# Patient Record
Sex: Female | Born: 1975 | Race: White | Hispanic: No | Marital: Married | Smoking: Never smoker
Health system: Southern US, Community
[De-identification: ages and names within clinical notes are randomized; demographics above are authoritative.]

## PROBLEM LIST (undated history)

## (undated) HISTORY — PX: COLONOSCOPY: SHX174

---

## 1999-07-16 ENCOUNTER — Other Ambulatory Visit: Admission: RE | Admit: 1999-07-16 | Discharge: 1999-07-16 | Payer: Self-pay | Admitting: *Deleted

## 2000-12-23 ENCOUNTER — Other Ambulatory Visit: Admission: RE | Admit: 2000-12-23 | Discharge: 2000-12-23 | Payer: Self-pay | Admitting: Obstetrics and Gynecology

## 2002-02-03 ENCOUNTER — Other Ambulatory Visit: Admission: RE | Admit: 2002-02-03 | Discharge: 2002-02-03 | Payer: Self-pay | Admitting: Obstetrics and Gynecology

## 2002-07-22 ENCOUNTER — Encounter: Payer: Self-pay | Admitting: Obstetrics and Gynecology

## 2002-07-22 ENCOUNTER — Observation Stay (HOSPITAL_COMMUNITY): Admission: AD | Admit: 2002-07-22 | Discharge: 2002-07-23 | Payer: Self-pay | Admitting: Obstetrics and Gynecology

## 2002-09-13 ENCOUNTER — Ambulatory Visit (HOSPITAL_COMMUNITY): Admission: RE | Admit: 2002-09-13 | Discharge: 2002-09-13 | Payer: Self-pay | Admitting: Obstetrics and Gynecology

## 2002-09-13 ENCOUNTER — Encounter: Payer: Self-pay | Admitting: Obstetrics and Gynecology

## 2002-11-01 ENCOUNTER — Inpatient Hospital Stay (HOSPITAL_COMMUNITY): Admission: AD | Admit: 2002-11-01 | Discharge: 2002-11-03 | Payer: Self-pay | Admitting: Obstetrics and Gynecology

## 2002-11-02 ENCOUNTER — Encounter (INDEPENDENT_AMBULATORY_CARE_PROVIDER_SITE_OTHER): Payer: Self-pay | Admitting: Specialist

## 2002-11-04 ENCOUNTER — Encounter: Admission: RE | Admit: 2002-11-04 | Discharge: 2002-12-04 | Payer: Self-pay | Admitting: Obstetrics and Gynecology

## 2003-03-02 ENCOUNTER — Other Ambulatory Visit: Admission: RE | Admit: 2003-03-02 | Discharge: 2003-03-02 | Payer: Self-pay | Admitting: Obstetrics and Gynecology

## 2004-04-11 ENCOUNTER — Other Ambulatory Visit: Admission: RE | Admit: 2004-04-11 | Discharge: 2004-04-11 | Payer: Self-pay | Admitting: Obstetrics and Gynecology

## 2005-04-18 ENCOUNTER — Other Ambulatory Visit: Admission: RE | Admit: 2005-04-18 | Discharge: 2005-04-18 | Payer: Self-pay | Admitting: Obstetrics and Gynecology

## 2006-01-20 ENCOUNTER — Inpatient Hospital Stay (HOSPITAL_COMMUNITY): Admission: AD | Admit: 2006-01-20 | Discharge: 2006-01-20 | Payer: Self-pay | Admitting: Obstetrics and Gynecology

## 2006-01-23 ENCOUNTER — Inpatient Hospital Stay (HOSPITAL_COMMUNITY): Admission: AD | Admit: 2006-01-23 | Discharge: 2006-01-25 | Payer: Self-pay | Admitting: Obstetrics and Gynecology

## 2007-06-02 ENCOUNTER — Emergency Department (HOSPITAL_COMMUNITY): Admission: EM | Admit: 2007-06-02 | Discharge: 2007-06-02 | Payer: Self-pay | Admitting: Emergency Medicine

## 2008-06-17 ENCOUNTER — Ambulatory Visit: Payer: Self-pay | Admitting: Internal Medicine

## 2008-06-17 DIAGNOSIS — R070 Pain in throat: Secondary | ICD-10-CM

## 2008-06-22 LAB — CONVERTED CEMR LAB: Streptococcus, Group A Screen (Direct): NEGATIVE

## 2008-07-04 ENCOUNTER — Telehealth: Payer: Self-pay | Admitting: Internal Medicine

## 2008-08-03 ENCOUNTER — Ambulatory Visit: Payer: Self-pay | Admitting: Gastroenterology

## 2008-08-10 ENCOUNTER — Encounter: Payer: Self-pay | Admitting: Gastroenterology

## 2008-08-10 ENCOUNTER — Ambulatory Visit: Payer: Self-pay | Admitting: Gastroenterology

## 2008-08-12 ENCOUNTER — Encounter: Payer: Self-pay | Admitting: Gastroenterology

## 2008-09-01 ENCOUNTER — Ambulatory Visit: Payer: Self-pay | Admitting: Family Medicine

## 2008-09-05 ENCOUNTER — Telehealth (INDEPENDENT_AMBULATORY_CARE_PROVIDER_SITE_OTHER): Payer: Self-pay | Admitting: *Deleted

## 2008-09-06 ENCOUNTER — Encounter (INDEPENDENT_AMBULATORY_CARE_PROVIDER_SITE_OTHER): Payer: Self-pay | Admitting: *Deleted

## 2008-09-06 LAB — CONVERTED CEMR LAB
ALT: 14 units/L (ref 0–35)
AST: 17 units/L (ref 0–37)
Albumin: 4.5 g/dL (ref 3.5–5.2)
Basophils Absolute: 0 10*3/uL (ref 0.0–0.1)
Bilirubin, Direct: 0 mg/dL (ref 0.0–0.3)
CO2: 30 meq/L (ref 19–32)
Calcium: 9.5 mg/dL (ref 8.4–10.5)
Chloride: 105 meq/L (ref 96–112)
Eosinophils Absolute: 0.1 10*3/uL (ref 0.0–0.7)
Glucose, Bld: 89 mg/dL (ref 70–99)
Hemoglobin: 13.1 g/dL (ref 12.0–15.0)
LDL Cholesterol: 117 mg/dL — ABNORMAL HIGH (ref 0–99)
MCV: 89.6 fL (ref 78.0–100.0)
Monocytes Absolute: 0.4 10*3/uL (ref 0.1–1.0)
Potassium: 4.5 meq/L (ref 3.5–5.1)
Sodium: 140 meq/L (ref 135–145)
Total Bilirubin: 0.8 mg/dL (ref 0.3–1.2)
Total Protein: 7.6 g/dL (ref 6.0–8.3)
VLDL: 11.4 mg/dL (ref 0.0–40.0)
WBC: 6.1 10*3/uL (ref 4.5–10.5)

## 2009-05-31 ENCOUNTER — Inpatient Hospital Stay (HOSPITAL_COMMUNITY): Admission: RE | Admit: 2009-05-31 | Discharge: 2009-06-02 | Payer: Self-pay | Admitting: Obstetrics and Gynecology

## 2010-02-27 ENCOUNTER — Encounter: Payer: Self-pay | Admitting: Family Medicine

## 2010-02-27 ENCOUNTER — Ambulatory Visit (INDEPENDENT_AMBULATORY_CARE_PROVIDER_SITE_OTHER): Payer: Managed Care, Other (non HMO) | Admitting: Family Medicine

## 2010-02-27 DIAGNOSIS — J019 Acute sinusitis, unspecified: Secondary | ICD-10-CM

## 2010-03-08 NOTE — Assessment & Plan Note (Signed)
Summary: sinus infection//fd   Vital Signs:  Patient profile:   35 year old female Weight:      124 pounds BMI:     22.95 Temp:     98.3 degrees F oral BP sitting:   100 / 60  (left arm)  Vitals Entered By: Doristine Devoid CMA (February 27, 2010 9:13 AM) CC: sinus HA and pressure x1 wk    CC:  sinus HA and pressure x1 wk .  History of Present Illness: 35 yo woman here today for ? sinus infxn.  sxs started  ~10 days ago w/ cold sxs.  now w/ facial pain, tooth pain, nasal congestion.  no fever, cough.  no ear pain.  + sick contacts.    Current Medications (verified): 1)  None  Allergies (verified): No Known Drug Allergies  Social History: married, 3 kids (04, 08, 83) pharmacist at [employer]  Review of Systems      See HPI  Physical Exam  General:  Well-developed,well-nourished,in no acute distress; alert,appropriate and cooperative throughout examination Head:  NCAT, + TTP over maxillary sinuses Eyes:  no injxn or inflammation Ears:  R ear normal and L ear normal.   Nose:  External nasal examination shows no deformity or inflammation. Nasal mucosa are pink and moist without lesions or exudates. Mouth:  Oral mucosa and oropharynx without lesions or exudates.  Teeth in good repair. Neck:  No deformities, masses, or tenderness noted. Lungs:  Normal respiratory effort, chest expands symmetrically. Lungs are clear to auscultation, no crackles or wheezes. Heart:  Normal rate and regular rhythm. S1 and S2 normal without gallop, murmur, click, rub or other extra sounds.   Impression & Recommendations:  Problem # 1:  SINUSITIS - ACUTE-NOS (ICD-461.9) Assessment New pt is breast feeding.  start amox.  reviewed supportive care and red flags that should prompt return.  Pt expresses understanding and is in agreement w/ this plan. Her updated medication list for this problem includes:    Amoxicillin 500 Mg Tabs (Amoxicillin) .Marland Kitchen... 2 tabs by mouth two times a day x10 days.  take w/  food.  Complete Medication List: 1)  Amoxicillin 500 Mg Tabs (Amoxicillin) .... 2 tabs by mouth two times a day x10 days.  take w/ food.  Patient Instructions: 1)  You have a sinus infection 2)  Take the Amoxicillin as directed- take w/ food to avoid upset stomach 3)  Drink plenty of fluids 4)  Hang in there!!! Prescriptions: AMOXICILLIN 500 MG TABS (AMOXICILLIN) 2 tabs by mouth two times a day x10 days.  take w/ food.  #40 x 0   Entered and Authorized by:   Neena Rhymes MD   Signed by:   Neena Rhymes MD on 02/27/2010   Method used:   Electronically to        [facility] [id number] (retail)       [address]       [place], [place]  [address]       Ph: [phone]       Fax: [phone]   RxID:   [id number]    Orders Added: 1)  Est. Patient Level III [64332]

## 2010-04-10 LAB — CBC
MCHC: 35.3 g/dL (ref 30.0–36.0)
RBC: 3.18 MIL/uL — ABNORMAL LOW (ref 3.87–5.11)
RDW: 14.5 % (ref 11.5–15.5)
RDW: 14.6 % (ref 11.5–15.5)

## 2010-04-10 LAB — RPR: RPR Ser Ql: NONREACTIVE

## 2010-06-06 ENCOUNTER — Encounter: Payer: Self-pay | Admitting: *Deleted

## 2010-06-07 ENCOUNTER — Encounter: Payer: Self-pay | Admitting: Family Medicine

## 2010-06-07 ENCOUNTER — Ambulatory Visit (INDEPENDENT_AMBULATORY_CARE_PROVIDER_SITE_OTHER): Payer: Managed Care, Other (non HMO) | Admitting: Family Medicine

## 2010-06-07 VITALS — BP 100/60 | Temp 99.0°F | Wt 122.0 lb

## 2010-06-07 DIAGNOSIS — J40 Bronchitis, not specified as acute or chronic: Secondary | ICD-10-CM

## 2010-06-07 MED ORDER — BENZONATATE 200 MG PO CAPS: 200.0000 mg | ORAL_CAPSULE | Freq: Three times a day (TID) | ORAL | Status: AC | PRN

## 2010-06-07 MED ORDER — AZITHROMYCIN 250 MG PO TABS: 250.0000 mg | ORAL_TABLET | Freq: Every day | ORAL | Status: AC

## 2010-06-07 MED ORDER — GUAIFENESIN-CODEINE 100-10 MG/5ML PO SYRP
5.0000 mL | ORAL_SOLUTION | Freq: Three times a day (TID) | ORAL | Status: AC | PRN
Start: 2010-06-07 — End: 2011-06-07

## 2010-06-07 NOTE — Assessment & Plan Note (Signed)
Pt's sxs consistent w/ bronchitis.  Start zpack.  Cough meds prn.  Reviewed supportive care and red flags that should prompt return. Pt expressed understanding and is in agreement w/ plan.

## 2010-06-07 NOTE — Progress Notes (Signed)
  Subjective:    Patient ID: [name], female    DOB: July 16, 1975, 35 y.o.   MRN: [id number]  HPI URI- Tm 101.6, fever started Monday.  Cough started Tuesday. Cough is 'hacking' but nonproductive.  No ear pain, nasal congestion, facial pain.  Recent exposure to [place] but pt has no rash.   Review of Systems For ROS see HPI    Objective:   Physical Exam  Constitutional: She appears well-developed and well-nourished. No distress.  HENT:  Head: Normocephalic and atraumatic.       TMs normal bilaterally Mild nasal congestion Throat w/out erythema, edema, or exudate  Eyes: Conjunctivae and EOM are normal. Pupils are equal, round, and reactive to light.  Neck: Normal range of motion. Neck supple.  Cardiovascular: Normal rate, regular rhythm, normal heart sounds and intact distal pulses.   No murmur heard. Pulmonary/Chest: Effort normal and breath sounds normal. No respiratory distress. She has no wheezes.       + hacking cough  Lymphadenopathy:    She has no cervical adenopathy.          Assessment & Plan:

## 2010-06-07 NOTE — Patient Instructions (Signed)
Follow up as needed This appears to be bronchitis- take the azithromycin as directed Drink plenty of fluids Alternate tylenol and ibuprofen for pain/fever Use the cough meds as needed REST! Call with any questions or concerns Hang in there!!
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
## 2010-06-08 NOTE — H&P (Signed)
[name], [name]                 ACCOUNT NO.:  [id number]   MEDICAL RECORD NO.:  [id number]          PATIENT TYPE:  INP   LOCATION:                                [facility]   PHYSICIAN:  Crist Fat. Rivard, M.D. DATE OF BIRTH:  28-Sep-1975   DATE OF ADMISSION:  01/23/2006  DATE OF DISCHARGE:  01/25/2006                              HISTORY & PHYSICAL   This is a 35 year old gravida 2 para 1-0-0-1 at 5 weeks who presents  for elective induction of labor.  Pregnancy has been followed by Dr.  Estanislado Pandy and remarkable for:  1. History of mitral valve prolapse.  2.  History of IUGR with abnormal Dopplers in previous pregnancy.  3. Group  B strep positive.   ALLERGIES:  THE PATIENT REPORTS A REMOTE HISTORY OF RED DYE ALLERGY AS A  CHILD.   OB HISTORY:  Remarkable for a vaginal delivery in 2004 of a female infant  at [redacted] weeks gestation weighing 5 pounds 11 ounces, remarkable for IUGR  and abnormal dopplers.   MEDICAL HISTORY:  Remarkable for childhood varicella, mitral valve  prolapse diagnosed in 2004 with small regurg.   SURGICAL HISTORY:  Remarkable for wisdom teeth in 1993.   FAMILY HISTORY:  Remarkable for father and grandmother with heart  disease.  Grandmother times two with hypertension.  Grandfather with  diabetes.  Father, aunts and cousins with thyroid disorders.  Grandfather with Alzheimer's, and a grandmother with stroke.  Mother and  grandmother with colon cancer, and a cousin who is bipolar.   GENETIC HISTORY:  Remarkable for father of the baby's cousin with  Williams syndrome and son with amblyopia.   SOCIAL HISTORY:  The patient is married to Sherald Balbuena who is involved  and supportive.  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  She is of  the [employer].  She denies any alcohol, tobacco or drug use.   PRENATAL LABS:  Hemoglobin 11.7, platelets 311, blood type O positive,  antibody screen negative, RPR nonreactive, rubella immune, hepatitis  negative, HIV  declined, PEP test normal, gonorrhea negative, Chlamydia  negative, cystic fibrosis negative.   HISTORY OF CURRENT PREGNANCY:  The patient entered care at [redacted] weeks  gestation.  She was treated with Zofran for nausea and vomiting.  She  had a 10-week ultrasound for dates and a nuchal translucency that was  normal at 12 weeks.  She had a 19-week ultrasound that was normal.  Her  first trimester screen was also normal.  She had an ultrasound at 26  weeks which showed 66th percentile growth and one at 32 weeks that  showed 70th percentile growth, and she was group B strep positive at  term.   OBJECTIVE DATA:  VITAL SIGNS:  Stable, afebrile.  HEENT:  Within normal limits.  Thyroid normal, not enlarged.  CHEST:  Clear to auscultation.  HEART:  Regular rate and rhythm.  ABDOMEN:  Gravid 39 cm.  Vertex to Leopold's.  EFM shows reactive fetal  heart rate with contractions currently every four minutes on pitocin.  Cervix was 3 cm  50% effaced -2 station, vertex  presentation by R.N.  exam.  EXTREMITIES:  Within normal limits.   ASSESSMENT:  1. Intrauterine pregnancy at 39 weeks.  2. Elective induction of labor.   PLAN:  1. Admit to [employer] [employer].  2. Routine medical doctor orders.  3. Group B strep prophylaxis.      Marie L. Williams, C.N.M.      Crist Fat Rivard, M.D.  Electronically Signed    MLW/MEDQ  D:  01/23/2006  T:  01/23/2006  Job:  [id number]

## 2011-09-16 ENCOUNTER — Ambulatory Visit (INDEPENDENT_AMBULATORY_CARE_PROVIDER_SITE_OTHER): Payer: Managed Care, Other (non HMO) | Admitting: Obstetrics and Gynecology

## 2011-09-16 ENCOUNTER — Encounter: Payer: Self-pay | Admitting: Obstetrics and Gynecology

## 2011-09-16 VITALS — BP 98/60 | HR 66 | Ht 61.0 in | Wt 122.0 lb

## 2011-09-16 DIAGNOSIS — Z124 Encounter for screening for malignant neoplasm of cervix: Secondary | ICD-10-CM

## 2011-09-16 DIAGNOSIS — Z01419 Encounter for gynecological examination (general) (routine) without abnormal findings: Secondary | ICD-10-CM

## 2011-09-16 NOTE — Progress Notes (Signed)
The patient reports:no complaints  Contraception:condoms  Last mammogram: not applicable  Last pap: was normal  June  2012  GC/Chlamydia cultures offered: declined HIV/RPR/HbsAg offered:  declined HSV 1 and 2 glycoprotein offered: declined  Menstrual cycle regular and monthly: Yes Menstrual flow normal: Yes  Urinary symptoms: none Normal bowel movements: Yes Reports abuse at home: No:   Subjective:    [name] is a 36 y.o. female, G3P3, who presents for an annual exam.     History   Social History  . Marital Status: Married    Spouse Name: N/A    Number of Children: N/A  . Years of Education: N/A   Social History Main Topics  . Smoking status: Never Smoker   . Smokeless tobacco: None  . Alcohol Use: Yes     occ.  . Drug Use: No  . Sexually Active: None   Other Topics Concern  . None   Social History Narrative  . None    Menstrual cycle:   LMP: Patient's last menstrual period was 09/10/2011.           Cycle: normal  The following portions of the patient's history were reviewed and updated as appropriate: allergies, current medications, past family history, past medical history, past social history, past surgical history and problem list.  Review of Systems Pertinent items are noted in HPI. Breast:Negative for breast lump,nipple discharge or nipple retraction Gastrointestinal: Negative for abdominal pain, change in bowel habits or rectal bleeding Urinary:negative   Objective:    BP 98/60  Pulse 66  Ht 5\' 1"  (1.549 m)  Wt 122 lb (55.339 kg)  BMI 23.05 kg/m2  LMP 09/10/2011    Weight:  Wt Readings from Last 1 Encounters:  09/16/11 122 lb (55.339 kg)          BMI: Body mass index is 23.05 kg/(m^2).  General Appearance: Alert, appropriate appearance for age. No acute distress HEENT: Grossly normal Neck / Thyroid: Supple, no masses, nodes or enlargement Lungs: clear to auscultation bilaterally Back: No CVA tenderness Breast Exam: No masses or  nodes.No dimpling, nipple retraction or discharge. Cardiovascular: Regular rate and rhythm. 3/6 systolic murmur. Gastrointestinal: Soft, non-tender, no masses or organomegaly Pelvic Exam: Vulva and vagina appear normal. Bimanual exam reveals normal uterus and adnexa.RV Rectovaginal: not indicated Lymphatic Exam: Non-palpable nodes in neck, clavicular, axillary, or inguinal regions Skin: no rash or abnormalities Neurologic: Normal gait and speech, no tremor  Psychiatric: Alert and oriented, appropriate affect.     Assessment:    Normal gyn exam Systolic murmur    Plan:    pap smear return annually or prn Will see her cardiologist  STD screening: declined Contraception:condoms      MD

## 2011-09-18 LAB — PAP IG W/ RFLX HPV ASCU

## 2011-09-19 ENCOUNTER — Ambulatory Visit: Payer: Self-pay | Admitting: Obstetrics and Gynecology

## 2012-04-15 ENCOUNTER — Telehealth (HOSPITAL_COMMUNITY): Payer: Self-pay | Admitting: *Deleted

## 2012-04-15 MED ORDER — AMOXICILLIN-POT CLAVULANATE 875-125 MG PO TABS: 1.0000 | ORAL_TABLET | Freq: Two times a day (BID) | ORAL | Status: AC

## 2012-04-15 MED ORDER — AMOXICILLIN-POT CLAVULANATE 875-125 MG PO TABS: 1.0000 | ORAL_TABLET | Freq: Two times a day (BID) | ORAL | Status: DC

## 2012-04-15 NOTE — Telephone Encounter (Signed)
Per Dr Gala Romney pt needs Augmentin for 7 days, rx sent in

## 2012-08-05 ENCOUNTER — Encounter: Payer: Managed Care, Other (non HMO) | Admitting: Family Medicine

## 2012-08-27 ENCOUNTER — Telehealth: Payer: Self-pay | Admitting: Gastroenterology

## 2012-08-27 NOTE — Telephone Encounter (Signed)
Pt has had rectal bleeding for the past 3 days she will be out of town until Monday morning and would like to see someone Monday afternoon.  She was put on with Amy Esterwood for 08/31/12 2 pm

## 2012-08-31 ENCOUNTER — Ambulatory Visit (INDEPENDENT_AMBULATORY_CARE_PROVIDER_SITE_OTHER): Payer: Managed Care, Other (non HMO) | Admitting: Physician Assistant

## 2012-08-31 ENCOUNTER — Encounter: Payer: Self-pay | Admitting: Physician Assistant

## 2012-08-31 VITALS — BP 94/58 | HR 76 | Ht 61.0 in | Wt 118.0 lb

## 2012-08-31 DIAGNOSIS — Z860100 Personal history of colon polyps, unspecified: Secondary | ICD-10-CM

## 2012-08-31 DIAGNOSIS — K625 Hemorrhage of anus and rectum: Secondary | ICD-10-CM

## 2012-08-31 DIAGNOSIS — Z800 Family history of malignant neoplasm of digestive organs: Secondary | ICD-10-CM

## 2012-08-31 MED ORDER — MOVIPREP 100 G PO SOLR: 1.0000 | Freq: Once | ORAL | Status: DC

## 2012-08-31 NOTE — Patient Instructions (Addendum)

## 2012-08-31 NOTE — Progress Notes (Signed)
Subjective:    Patient ID: [name], female    DOB: Jun 07, 1975, 37 y.o.   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 This was done for screening per strong family history of colon cancer. She was found to have a 9 mm polyp in the a descending colon which was removed and passed was consistent with a hyperplastic polyp. She has family history of colon cancer in her maternal grandmother diagnosed in her 41s and her mother diagnosed in her 67s. Her mother had a stage IIIB colon cancer and required surgery as well as chemotherapy appear her sisters also have had colon polyps. Patient comes in today because she noticed blood in her stool last week. She says she saw a small amount of blood-streaked in with the bowel movement 3 days last week. She had not had any straining hard stools etc. She has no history of hemorrhoids and has had no rectal discomfort pressure etc. She says she feels fine otherwise has not been constipated. Her bowel movements have been normal she has no abdominal pain. She did not see any blood in her stool today.    Review of Systems  Constitutional: Negative.   HENT: Negative.   Eyes: Negative.   Respiratory: Negative.   Cardiovascular: Negative.   Gastrointestinal: Positive for blood in stool.  Endocrine: Negative.   Genitourinary: Negative.   Musculoskeletal: Negative.   Skin: Negative.   Allergic/Immunologic: Negative.   Neurological: Negative.   Hematological: Negative.    Outpatient Prescriptions Prior to Visit  Medication Sig Dispense Refill  . Calcium Carbonate-Vitamin D 600-200 MG-UNIT TABS Take by mouth.      . Multiple Vitamins-Minerals (MULTIVITAMIN WITH MINERALS) tablet Take 1 tablet by mouth daily.       No facility-administered medications prior to visit.      No Known Allergies Patient Active Problem List   Diagnosis Date Noted  . Personal history  of colonic polyps 08/31/2012  . Bronchitis 06/07/2010  . SINUSITIS - ACUTE-NOS 02/27/2010  . THROAT PAIN 06/17/2008   History  Substance Use Topics  . Smoking status: Never Smoker   . Smokeless tobacco: Never Used  . Alcohol Use: Yes     Comment: occ.   family history includes Colon cancer (age of onset: 62) in her sister; Colon cancer (age of onset: 66) in her maternal grandmother; Colon cancer (age of onset: 57) in her mother; Coronary artery disease in her paternal grandmother; Diabetes in her maternal grandfather; Hypertension in her maternal grandfather; and Stroke in her paternal grandmother.   Objective:   Physical Exam well-developed white female in no acute distress, pleasant blood pressure 94/58 pulse 76 height 5 foot 1 weight 118. HEENT; nontraumatic normocephalic EOMI PERRLA sclera anicteric, Supple; no JVD, Cardiovascular; regular rate and rhythm with S1-S2 no murmur or gallop, Pulmonary; clear bilaterally, Abdomen ;soft nontender nondistended bowel sounds are active there is no palpable mass or hepatosplenomegaly, Rectal; exam no external hemorrhoid or lesion noted stool is brown and currently Hemoccult negative no abnormality on digital, Extremities; no clubbing cyanosis or edema skin warm and dry, Psych; mood and affect normal and appropriate        Assessment & Plan:  #56  37 year old white female with bright red blood noted in stool. X 3 She has no evidence of hemorrhoids on exam and stool is currently Hemoccult negative. Patient  has strong family history of colon cancer in both her mother and maternal grandmother. Last colonoscopy was done 4 years ago with one hyperplastic polyp found. Etiology of her bleeding is not clear ;we'll need to rule out occult colon lesion  Plan; Schedule for colonoscopy with Dr. Kathlee Nations was discussed in detail with the patient and she is agreeable to proceed

## 2012-09-01 ENCOUNTER — Encounter: Payer: Self-pay | Admitting: Gastroenterology

## 2012-09-06 NOTE — Progress Notes (Signed)
i agree with the plan above 

## 2012-09-09 ENCOUNTER — Emergency Department (INDEPENDENT_AMBULATORY_CARE_PROVIDER_SITE_OTHER)
Admission: EM | Admit: 2012-09-09 | Discharge: 2012-09-09 | Disposition: A | Discharge status: Home / Self Care | Payer: Managed Care, Other (non HMO) | Source: Home / Self Care

## 2012-09-09 ENCOUNTER — Encounter (HOSPITAL_COMMUNITY): Payer: Self-pay | Admitting: Emergency Medicine

## 2012-09-09 DIAGNOSIS — N390 Urinary tract infection, site not specified: Secondary | ICD-10-CM

## 2012-09-09 MED ORDER — CEPHALEXIN 500 MG PO CAPS: 500.0000 mg | ORAL_CAPSULE | Freq: Four times a day (QID) | ORAL | Status: DC

## 2012-09-09 NOTE — ED Provider Notes (Signed)
CSN: [id number]     Arrival date & time 09/09/12  1328 History     First MD Initiated Contact with Patient 09/09/12 1437     Chief Complaint  Patient presents with  . 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 She took a Levaquin 3 days ago as he was yelling tablet she had. Her symptoms persist. Urinalysis shows trace leukocytes only. She has no fever, abdominal pain, nausea or vomiting or pelvic pain. No back pain.   History reviewed. No pertinent past medical history. History reviewed. No pertinent past surgical history. Family History  Problem Relation Age of Onset  . Coronary artery disease Paternal Grandmother   . Hypertension Maternal Grandfather   . Diabetes Maternal Grandfather   . Stroke Paternal Grandmother   . Colon cancer Mother 69  . Colon cancer Maternal Grandmother 60  . Colon cancer Sister 30    pre-cancerous polyp   History  Substance Use Topics  . Smoking status: Never Smoker   . Smokeless tobacco: Never Used  . Alcohol Use: Yes     Comment: occ.   OB History   Grav Para Term Preterm Abortions TAB SAB Ect Mult Living   3 3             Review of Systems  Constitutional: Negative.   Respiratory: Negative.   Cardiovascular: Negative.   Genitourinary: Positive for dysuria, urgency and frequency. Negative for vaginal bleeding, vaginal discharge and pelvic pain.  Neurological: Negative.     Allergies  Review of patient's allergies indicates no known allergies.  Home Medications   Current Outpatient Rx  Name  Route  Sig  Dispense  Refill  . Calcium Carbonate-Vitamin D 600-200 MG-UNIT TABS   Oral   Take by mouth.         . cephALEXin (KEFLEX) 500 MG capsule   Oral   Take 1 capsule (500 mg total) by mouth 4 (four) times daily.   28 capsule   0   . cephALEXin (KEFLEX) 500 MG capsule   Oral   Take 1 capsule (500 mg total) by mouth 4 (four) times daily.   28 capsule   0   . MOVIPREP 100 G SOLR   Oral   Take 1 kit (200 g total) by mouth once. "Pharmacist please use BIN: [id number] GROUP: [id number] ID: [id number] Call [phone] for pharmacy questions "Pt will save $10"   1 kit   0     Dispense as written.   . Multiple Vitamins-Minerals (MULTIVITAMIN WITH MINERALS) tablet   Oral   Take 1 tablet by mouth daily.          LMP 08/21/2012 Physical Exam  Nursing note and vitals reviewed. Constitutional: She is oriented to person, place, and time. She appears well-developed and well-nourished. No distress.  Neck: Normal range of motion. Neck supple.  Pulmonary/Chest: Effort normal. No respiratory distress.  Abdominal: Soft. She exhibits no distension and no mass. There is no tenderness. There is no rebound.  Neurological: She is alert and oriented to person, place, and time.  Skin: Skin is warm and dry.  Psychiatric: She has a normal mood and affect.    ED Course   Procedures (including critical care time)  Labs Reviewed  URINE CULTURE  POCT PREGNANCY, URINE   Results for orders placed during the hospital encounter of 09/09/12  POCT PREGNANCY, URINE      Result Value Range   Preg Test, Ur NEGATIVE  NEGATIVE    No results found. 1. UTI (lower urinary tract infection)     MDM  Keflex 500 mg 4 times a day for 7 days, drink plenty of fluids stay well hydrated May take cream as needed. There are 2 prescriptions for Keflex 500 mg. The first was printed and then destroyed the second was described to [facility]. Urine culture was ordered. Urine results and a cross over to the computer screens. Urinalysis had trace leukocytes otherwise other variables within normal limits.  Hayden Rasmussen, NP 09/09/12 1517

## 2012-09-09 NOTE — ED Notes (Signed)
C/o uti.  Patient states she has not had one since she was young

## 2012-09-10 NOTE — ED Provider Notes (Signed)
Medical screening examination/treatment/procedure(s) were performed by non-physician practitioner and as supervising physician I was immediately available for consultation/collaboration.   MORENO-COLL,Mea Ozga; MD  Copeland Lapier Moreno-Coll, MD 09/10/12 0817 

## 2012-09-11 LAB — URINE CULTURE: Culture: NO GROWTH

## 2012-09-15 ENCOUNTER — Other Ambulatory Visit: Payer: Managed Care, Other (non HMO) | Admitting: Gastroenterology

## 2012-09-17 ENCOUNTER — Telehealth: Payer: Self-pay | Admitting: *Deleted

## 2012-09-17 ENCOUNTER — Other Ambulatory Visit: Payer: Self-pay | Admitting: *Deleted

## 2012-09-17 ENCOUNTER — Encounter: Payer: Managed Care, Other (non HMO) | Admitting: Family Medicine

## 2012-09-17 MED ORDER — MOVIPREP 100 G PO SOLR: 1.0000 | Freq: Once | ORAL | Status: DC

## 2012-09-17 NOTE — Telephone Encounter (Signed)
I called the pharmacy, [facility], and they said the patient thought the prep was too expensive.  I called the patient and her husband answered and I told him I have a Moviprep sample at the front desk.  He thanked me for doing that .

## 2012-09-22 ENCOUNTER — Encounter: Payer: Self-pay | Admitting: Gastroenterology

## 2012-09-22 ENCOUNTER — Ambulatory Visit (AMBULATORY_SURGERY_CENTER): Payer: Managed Care, Other (non HMO) | Admitting: Gastroenterology

## 2012-09-22 VITALS — BP 94/57 | HR 61 | Temp 97.9°F | Resp 12 | Ht 61.0 in | Wt 112.0 lb

## 2012-09-22 DIAGNOSIS — Z86010 Personal history of colonic polyps: Secondary | ICD-10-CM

## 2012-09-22 DIAGNOSIS — Z800 Family history of malignant neoplasm of digestive organs: Secondary | ICD-10-CM

## 2012-09-22 DIAGNOSIS — K625 Hemorrhage of anus and rectum: Secondary | ICD-10-CM

## 2012-09-22 MED ORDER — SODIUM CHLORIDE 0.9 % IV SOLN: 500.0000 mL | INTRAVENOUS | Status: DC

## 2012-09-22 NOTE — Progress Notes (Signed)
Patient did not experience any of the following events: a burn prior to discharge; a fall within the facility; wrong site/side/patient/procedure/implant event; or a hospital transfer or hospital admission upon discharge from the facility. (G8907) Patient did not have preoperative order for IV antibiotic SSI prophylaxis. (G8918)  

## 2012-09-22 NOTE — Patient Instructions (Addendum)
Discharge instructions given with verbal understanding. Handouts on hemorrhoids and a high fiber diet given. Resume previous medications. YOU HAD AN ENDOSCOPIC PROCEDURE TODAY AT THE [facility]: Refer to the procedure report that was given to you for any specific questions about what was found during the examination.  If the procedure report does not answer your questions, please call your gastroenterologist to clarify.  If you requested that your care partner not be given the details of your procedure findings, then the procedure report has been included in a sealed envelope for you to review at your convenience later.  YOU SHOULD EXPECT: Some feelings of bloating in the abdomen. Passage of more gas than usual.  Walking can help get rid of the air that was put into your GI tract during the procedure and reduce the bloating. If you had a lower endoscopy (such as a colonoscopy or flexible sigmoidoscopy) you may notice spotting of blood in your stool or on the toilet paper. If you underwent a bowel prep for your procedure, then you may not have a normal bowel movement for a few days.  DIET: Your first meal following the procedure should be a light meal and then it is ok to progress to your normal diet.  A half-sandwich or bowl of soup is an example of a good first meal.  Heavy or fried foods are harder to digest and may make you feel nauseous or bloated.  Likewise meals heavy in dairy and vegetables can cause extra gas to form and this can also increase the bloating.  Drink plenty of fluids but you should avoid alcoholic beverages for 24 hours.  ACTIVITY: Your care partner should take you home directly after the procedure.  You should plan to take it easy, moving slowly for the rest of the day.  You can resume normal activity the day after the procedure however you should NOT DRIVE or use heavy machinery for 24 hours (because of the sedation medicines used during the test).    SYMPTOMS TO  REPORT IMMEDIATELY: A gastroenterologist can be reached at any hour.  During normal business hours, 8:30 AM to 5:00 PM Monday through Friday, call [phone].  After hours and on weekends, please call the GI answering service at [phone] who will take a message and have the physician on call contact you.   Following lower endoscopy (colonoscopy or flexible sigmoidoscopy):  Excessive amounts of blood in the stool  Significant tenderness or worsening of abdominal pains  Swelling of the abdomen that is new, acute  Fever of 100F or higher FOLLOW UP: If any biopsies were taken you will be contacted by phone or by letter within the next 1-3 weeks.  Call your gastroenterologist if you have not heard about the biopsies in 3 weeks.  Our staff will call the home number listed on your records the next business day following your procedure to check on you and address any questions or concerns that you may have at that time regarding the information given to you following your procedure. This is a courtesy call and so if there is no answer at the home number and we have not heard from you through the emergency physician on call, we will assume that you have returned to your regular daily activities without incident.  SIGNATURES/CONFIDENTIALITY: You and/or your care partner have signed paperwork which will be entered into your electronic medical record.  These signatures attest to the fact that that the information above on your   After Visit Summary has been reviewed and is understood.  Full responsibility of the confidentiality of this discharge information lies with you and/or your care-partner.  

## 2012-09-22 NOTE — Op Note (Signed)
[facility] [address] [place], [address]   COLONOSCOPY PROCEDURE REPORT  PATIENT: [name], [name]  MR#: [id number] BIRTHDATE: [birth year] , 37  yrs. old GENDER: Female ENDOSCOPIST: Rachael Fee, MD PROCEDURE DATE:  09/22/2012 PROCEDURE:   Colonoscopy, screening First Screening Colonoscopy - Avg.  risk and is 50 yrs.  old or older - No.  Prior Negative Screening - Now for repeat screening. Above average risk  History of Adenoma - Now for follow-up colonoscopy & has been > or = to 3 yrs.  N/A  Polyps Removed Today? No.  Recommend repeat exam, <10 yrs? Yes.  High risk (family or personal hx). ASA CLASS:   Class II INDICATIONS:family history of colon cancer (mother in her 47s), colonoscopy 2010 found HP polyp only; recent red rectal bleeding.  MEDICATIONS: Fentanyl 75 mcg IV, Versed 7 mg IV, and These medications were titrated to patient response per physician's verbal order  DESCRIPTION OF PROCEDURE:   After the risks benefits and alternatives of the procedure were thoroughly explained, informed consent was obtained.  A digital rectal exam revealed no abnormalities of the rectum.   The LB [id number]  endoscope was introduced through the anus and advanced to the cecum, which was identified by both the appendix and ileocecal valve. No adverse events experienced.   The quality of the prep was good, using MoviPrep  The instrument was then slowly withdrawn as the colon was fully examined.   COLON FINDINGS: A normal appearing cecum, ileocecal valve, and appendiceal orifice were identified.  The ascending, hepatic flexure, transverse, splenic flexure, descending, sigmoid colon and rectum appeared unremarkable.  No polyps or cancers were seen. Retroflexed views revealed no abnormalities. The time to cecum=2 minutes 27 seconds.  Withdrawal time=8 minutes 12 seconds.  The scope was withdrawn and the procedure completed. COMPLICATIONS: There were no  complications.  ENDOSCOPIC IMPRESSION: Normal colon; no polyps or cancers Your recent mild rectal bleeding was likely from hemorrhoids that can come and go  RECOMMENDATIONS: Given your significant family history of colon cancer, you should have a repeat colonoscopy in 5 years   eSigned:  Rachael Fee, MD 09/22/2012 9:50 AM   cc: Neena Rhymes, MD

## 2012-09-22 NOTE — Progress Notes (Signed)
Pt tolerated procedure well. em

## 2012-09-23 ENCOUNTER — Telehealth: Payer: Self-pay | Admitting: *Deleted

## 2012-09-23 NOTE — Telephone Encounter (Signed)
  Follow up Call-  Call back number 09/22/2012  Post procedure Call Back phone  # [phone]  Permission to leave phone message Yes     Patient questions:  Do you have a fever, pain , or abdominal swelling? no Pain Score  0 *  Have you tolerated food without any problems? yes  Have you been able to return to your normal activities? yes  Do you have any questions about your discharge instructions: Diet   no Medications  no Follow up visit  no  Do you have questions or concerns about your Care? no  Actions: * If pain score is 4 or above: No action needed, pain <4.

## 2012-11-04 ENCOUNTER — Telehealth: Payer: Self-pay

## 2012-11-04 NOTE — Telephone Encounter (Signed)
Medication and allergies: done  [facility] for local pharmacy   Immunizations due: Admin Tdap upon arrival  A/P:  Last:  PAP:   UTD 08/2011 MMG: na  Dexa: na  CCS: UTD 09/2012 DM: na HTN: na Lipids: na

## 2012-11-05 ENCOUNTER — Encounter: Payer: Self-pay | Admitting: Family Medicine

## 2012-11-05 ENCOUNTER — Ambulatory Visit (INDEPENDENT_AMBULATORY_CARE_PROVIDER_SITE_OTHER): Payer: Managed Care, Other (non HMO) | Admitting: Family Medicine

## 2012-11-05 VITALS — BP 94/60 | HR 81 | Temp 99.0°F | Resp 16 | Ht 62.0 in | Wt 113.1 lb

## 2012-11-05 DIAGNOSIS — Z23 Encounter for immunization: Secondary | ICD-10-CM

## 2012-11-05 DIAGNOSIS — Z0000 Encounter for general adult medical examination without abnormal findings: Secondary | ICD-10-CM | POA: Insufficient documentation

## 2012-11-05 NOTE — Patient Instructions (Signed)
Schedule your lab visit at your convenience We'll notify you of your lab results and make any changes if needed Keep up the good work!!  You look great! Happy Fall!!!

## 2012-11-05 NOTE — Assessment & Plan Note (Signed)
Pt's PE WNL.  Check labs when fasting.  UTD on GYN.  Anticipatory guidance provided.

## 2012-11-05 NOTE — Progress Notes (Signed)
  Subjective:    Patient ID: [name], female    DOB: [birth year], 37 y.o.   MRN: [id number]  HPI CPE- UTD on GYN.  No concerns.     Review of Systems Patient reports no vision/ hearing changes, adenopathy,fever, weight change,  persistant/recurrent hoarseness , swallowing issues, chest pain, palpitations, edema, persistant/recurrent cough, hemoptysis, dyspnea (rest/exertional/paroxysmal nocturnal), gastrointestinal bleeding (melena, rectal bleeding), abdominal pain, significant heartburn, bowel changes, GU symptoms (dysuria, hematuria, incontinence), Gyn symptoms (abnormal  bleeding, pain),  syncope, focal weakness, memory loss, numbness & tingling, skin/hair/nail changes, abnormal bruising or bleeding, anxiety, or depression.     Objective:   Physical Exam General Appearance:    Alert, cooperative, no distress, appears stated age  Head:    Normocephalic, without obvious abnormality, atraumatic  Eyes:    PERRL, conjunctiva/corneas clear, EOM's intact, fundi    benign, both eyes  Ears:    Normal TM's and external ear canals, both ears  Nose:   Nares normal, septum midline, mucosa normal, no drainage    or sinus tenderness  Throat:   Lips, mucosa, and tongue normal; teeth and gums normal  Neck:   Supple, symmetrical, trachea midline, no adenopathy;    Thyroid: no enlargement/tenderness/nodules  Back:     Symmetric, no curvature, ROM normal, no CVA tenderness  Lungs:     Clear to auscultation bilaterally, respirations unlabored  Chest Wall:    No tenderness or deformity   Heart:    Regular rate and rhythm, S1 and S2 normal, no murmur, rub   or gallop  Breast Exam:    Deferred to GYN  Abdomen:     Soft, non-tender, bowel sounds active all four quadrants,    no masses, no organomegaly  Genitalia:    Deferred to GYN  Rectal:    Extremities:   Extremities normal, atraumatic, no cyanosis or edema  Pulses:   2+ and symmetric all extremities  Skin:   Skin color, texture, turgor normal, no  rashes or lesions  Lymph nodes:   Cervical, supraclavicular, and axillary nodes normal  Neurologic:   CNII-XII intact, normal strength, sensation and reflexes    throughout          Assessment & Plan:

## 2013-04-08 ENCOUNTER — Telehealth (HOSPITAL_COMMUNITY): Payer: Self-pay | Admitting: *Deleted

## 2013-04-08 MED ORDER — DICLOFENAC SODIUM 1 % TD GEL: 2.0000 g | Freq: Four times a day (QID) | TRANSDERMAL | Status: DC

## 2013-04-08 NOTE — Telephone Encounter (Signed)
Per Dr Haroldine Laws pt needs voltaren gel QID for right ankle, order send in

## 2013-05-04 ENCOUNTER — Encounter: Payer: Self-pay | Admitting: General Practice

## 2013-05-04 NOTE — Progress Notes (Signed)
Letter mailed to pt to notify that labs were not performed at last visit as ordered. Pt advised to contact office to schedule appt.  

## 2013-05-31 ENCOUNTER — Telehealth: Payer: Self-pay | Admitting: *Deleted

## 2013-05-31 DIAGNOSIS — Z0000 Encounter for general adult medical examination without abnormal findings: Secondary | ICD-10-CM

## 2013-05-31 NOTE — Telephone Encounter (Signed)
Caller name:  Loree Relation to pt:  self Call back number:  [phone] Pharmacy:  Reason for call:   Pt needs Fasting labs from 11/05/2012 CPE.  The orders in her chart have been cancelled.  Please reorder labs. Pt is requesting to have labs drawn at [facility].  Please advise.  bw

## 2013-05-31 NOTE — Telephone Encounter (Signed)
Labs ordered for [name] and pt notified.

## 2013-05-31 NOTE — Telephone Encounter (Signed)
Is it ok to reorder? Or does she need an appt?

## 2013-05-31 NOTE — Telephone Encounter (Signed)
Ok for CBC w/ diff, BMP, LFTs, lipid panel, TSH, Vit D (Dx v70.0)

## 2013-06-09 ENCOUNTER — Other Ambulatory Visit (INDEPENDENT_AMBULATORY_CARE_PROVIDER_SITE_OTHER): Payer: Managed Care, Other (non HMO)

## 2013-06-09 ENCOUNTER — Encounter: Payer: Self-pay | Admitting: General Practice

## 2013-06-09 DIAGNOSIS — Z0000 Encounter for general adult medical examination without abnormal findings: Secondary | ICD-10-CM

## 2013-06-09 LAB — HEPATIC FUNCTION PANEL
ALBUMIN: 4.2 g/dL (ref 3.5–5.2)
ALK PHOS: 41 U/L (ref 39–117)
ALT: 12 U/L (ref 0–35)
AST: 18 U/L (ref 0–37)
BILIRUBIN TOTAL: 0.3 mg/dL (ref 0.2–1.2)
Bilirubin, Direct: 0.1 mg/dL (ref 0.0–0.3)
Total Protein: 7 g/dL (ref 6.0–8.3)

## 2013-06-09 LAB — CBC WITH DIFFERENTIAL/PLATELET
BASOS PCT: 0.5 % (ref 0.0–3.0)
Basophils Absolute: 0 10*3/uL (ref 0.0–0.1)
EOS PCT: 2 % (ref 0.0–5.0)
Eosinophils Absolute: 0.1 10*3/uL (ref 0.0–0.7)
HCT: 33.4 % — ABNORMAL LOW (ref 36.0–46.0)
Hemoglobin: 11 g/dL — ABNORMAL LOW (ref 12.0–15.0)
LYMPHS PCT: 25.6 % (ref 12.0–46.0)
Lymphs Abs: 1.7 10*3/uL (ref 0.7–4.0)
MCHC: 32.9 g/dL (ref 30.0–36.0)
MCV: 80.4 fl (ref 78.0–100.0)
MONO ABS: 0.5 10*3/uL (ref 0.1–1.0)
Monocytes Relative: 8 % (ref 3.0–12.0)
NEUTROS PCT: 63.9 % (ref 43.0–77.0)
Neutro Abs: 4.2 10*3/uL (ref 1.4–7.7)
Platelets: 292 10*3/uL (ref 150.0–400.0)
RBC: 4.16 Mil/uL (ref 3.87–5.11)
RDW: 15.9 % — ABNORMAL HIGH (ref 11.5–15.5)
WBC: 6.6 10*3/uL (ref 4.0–10.5)

## 2013-06-09 LAB — BASIC METABOLIC PANEL
BUN: 13 mg/dL (ref 6–23)
CO2: 27 mEq/L (ref 19–32)
Calcium: 9.1 mg/dL (ref 8.4–10.5)
Chloride: 106 mEq/L (ref 96–112)
Creatinine, Ser: 0.7 mg/dL (ref 0.4–1.2)
GFR: 98.95 mL/min (ref >60.00)
GLUCOSE: 85 mg/dL (ref 70–99)
POTASSIUM: 5 meq/L (ref 3.5–5.1)
Sodium: 138 mEq/L (ref 135–145)

## 2013-06-09 LAB — LIPID PANEL
Cholesterol: 151 mg/dL (ref 0–200)
HDL: 64.5 mg/dL (ref >39.00)
LDL CALC: 81 mg/dL (ref 0–99)
Total CHOL/HDL Ratio: 2
Triglycerides: 30 mg/dL (ref 0.0–149.0)
VLDL: 6 mg/dL (ref 0.0–40.0)

## 2013-06-09 LAB — TSH: TSH: 2.57 u[IU]/mL (ref 0.35–4.50)

## 2013-06-10 ENCOUNTER — Encounter: Payer: Self-pay | Admitting: General Practice

## 2013-06-10 LAB — VITAMIN D 25 HYDROXY (VIT D DEFICIENCY, FRACTURES): Vit D, 25-Hydroxy: 40 ng/mL (ref 30–89)

## 2013-09-10 ENCOUNTER — Telehealth (HOSPITAL_COMMUNITY): Payer: Self-pay | Admitting: Anesthesiology

## 2013-09-10 MED ORDER — LEVOFLOXACIN 750 MG PO TABS: 750.0000 mg | ORAL_TABLET | Freq: Every day | ORAL | Status: DC

## 2013-09-10 NOTE — Telephone Encounter (Signed)
Patient has sinus infection. Will send abx in levaquin 750 mg daily for 5 days.   Junie Bame B NP-C 10:27 AM

## 2013-11-22 ENCOUNTER — Encounter: Payer: Self-pay | Admitting: Family Medicine

## 2013-12-02 LAB — HM PAP SMEAR: HM PAP: NORMAL

## 2013-12-07 ENCOUNTER — Ambulatory Visit (INDEPENDENT_AMBULATORY_CARE_PROVIDER_SITE_OTHER): Payer: Managed Care, Other (non HMO) | Admitting: Sports Medicine

## 2013-12-07 ENCOUNTER — Encounter: Payer: Self-pay | Admitting: Sports Medicine

## 2013-12-07 ENCOUNTER — Encounter: Payer: Self-pay | Admitting: General Practice

## 2013-12-07 VITALS — BP 109/75 | Ht 61.0 in | Wt 113.0 lb

## 2013-12-07 DIAGNOSIS — M533 Sacrococcygeal disorders, not elsewhere classified: Secondary | ICD-10-CM | POA: Insufficient documentation

## 2013-12-07 DIAGNOSIS — M357 Hypermobility syndrome: Secondary | ICD-10-CM

## 2013-12-07 NOTE — Assessment & Plan Note (Signed)
I think she is an excellent candidate for a home exercise program  This will emphasize core stability  She does need again hip abduction strength on the left  I would like to check this again in 2 months

## 2013-12-07 NOTE — Assessment & Plan Note (Signed)
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

## 2013-12-07 NOTE — Patient Instructions (Signed)
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

## 2013-12-07 NOTE — Progress Notes (Signed)
Patient ID: [name], female   DOB: May 09, 1975, 38 y.o.   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 She now has 3 children Her last child had problems walking and so she carried her a lot for 18 months Since that time she has had intermittent low back pain more to the left side No radiation No weakness or other discogenic features She gets relief by lying flat  She comes for evaluation at this time because for the last 3 months this has been steady and somewhat increasing in pain  Physical exam Physically fit female in no acute distress BP 109/75 mmHg  Ht 5\' 1"  (1.549 m)  Wt 113 lb (51.256 kg)  BMI 21.36 kg/m2  Hypermobility test shows palm to the floor;thumb to wrist bilaterally; fifth MTP past 90; left knee hyperextension She can do back bend She has hypermobility of her shoulders Skin shows a moderate increase in laxity of her extensor surfaces  Low back exam reveals mild tenderness over the left SI joint She has increased mobility of the SI joints Pain on pretzel stretch of the left SI joint only Normal leg lengths Weakness of hip abduction on the left  Straight leg raise 100 bilaterally FABER is normal  normal neurologic function

## 2014-03-28 ENCOUNTER — Other Ambulatory Visit (HOSPITAL_COMMUNITY): Payer: Self-pay | Admitting: *Deleted

## 2014-03-28 MED ORDER — DICLOFENAC SODIUM 1 % TD GEL: 2.0000 g | Freq: Four times a day (QID) | TRANSDERMAL | Status: DC

## 2014-06-09 ENCOUNTER — Encounter: Payer: Self-pay | Admitting: Gastroenterology

## 2014-10-26 ENCOUNTER — Other Ambulatory Visit: Payer: Self-pay | Admitting: Obstetrics and Gynecology

## 2015-10-23 ENCOUNTER — Telehealth (HOSPITAL_COMMUNITY): Payer: Self-pay | Admitting: *Deleted

## 2015-10-23 MED ORDER — SULFAMETHOXAZOLE-TRIMETHOPRIM 800-160 MG PO TABS
1.0000 | ORAL_TABLET | Freq: Two times a day (BID) | ORAL | 0 refills | Status: DC
Start: 2015-10-23 — End: 2018-05-27

## 2015-10-23 MED FILL — SULFAMETHOXAZOLE/TMP DS TAB: 800-160 | 3 days supply | Qty: 6 | Fill #0

## 2015-10-23 NOTE — Telephone Encounter (Signed)
rx sent in per DB

## 2016-03-26 LAB — HM MAMMOGRAPHY: HM Mammogram: NORMAL (ref 0–4)

## 2016-03-26 LAB — HM PAP SMEAR

## 2016-03-27 ENCOUNTER — Other Ambulatory Visit: Payer: Self-pay | Admitting: Obstetrics and Gynecology

## 2016-03-27 DIAGNOSIS — N6489 Other specified disorders of breast: Secondary | ICD-10-CM

## 2016-03-29 ENCOUNTER — Ambulatory Visit
Admission: RE | Admit: 2016-03-29 | Discharge: 2016-03-29 | Disposition: A | Discharge status: Home / Self Care | Payer: 59 | Source: Ambulatory Visit | Attending: Obstetrics and Gynecology | Admitting: Obstetrics and Gynecology

## 2016-03-29 DIAGNOSIS — N6489 Other specified disorders of breast: Secondary | ICD-10-CM

## 2016-04-02 ENCOUNTER — Encounter: Payer: Self-pay | Admitting: General Practice

## 2018-01-11 ENCOUNTER — Encounter: Payer: Self-pay | Admitting: Gastroenterology

## 2018-03-26 ENCOUNTER — Telehealth: Payer: Self-pay

## 2018-03-26 NOTE — Telephone Encounter (Signed)
-----   Message from Timothy Lasso, RN sent at 03/25/2018  9:01 AM EST -----  ----- Message ----- From: Timothy Lasso, RN Sent: 03/25/2018 To: Timothy Lasso, RN   ----- Message ----- From: Timothy Lasso, RN Sent: 03/20/2018 To: Timothy Lasso, RN   ----- Message ----- From: Milus Banister, MD Sent: 03/13/2018  11:28 AM EST To: Timothy Lasso, RN  Yes, I was texting with her yesterday.  She has a Industrial/product designer and friend of mine.  She is also a Software engineer at [employer].  I was planning to give you a heads up but just having a time.  Sorry if she surprised you.  I deftly want to be able to do her colonoscopy that day April 14 for her.  If I have [place] time great if not then I would like to work on a lunch case or 730 case at 1 of the hospitals for her.  Thank you much ----- Message ----- From: Timothy Lasso, RN Sent: 03/13/2018  10:57 AM EST To: Milus Banister, MD  Dr Ardis Hughs I received a call from the pt and she states the two of you discussed her having a colon on 05/05/18 in the [place].  We do not have our schedule yet, I told her we would contact her as soon as the schedule is out to get her set up.  Is this ok?

## 2018-03-26 NOTE — Telephone Encounter (Signed)
Appt made for 05/05/18 at 11 am and previsit 04/29/18 at 8 am.  Pt aware

## 2018-05-05 ENCOUNTER — Encounter: Payer: 59 | Admitting: Gastroenterology

## 2018-05-27 ENCOUNTER — Other Ambulatory Visit: Payer: Self-pay

## 2018-05-27 ENCOUNTER — Encounter: Payer: Self-pay | Admitting: Gastroenterology

## 2018-05-27 ENCOUNTER — Ambulatory Visit: Payer: 59 | Admitting: *Deleted

## 2018-05-27 VITALS — Ht 61.0 in | Wt 110.0 lb

## 2018-05-27 DIAGNOSIS — Z800 Family history of malignant neoplasm of digestive organs: Secondary | ICD-10-CM

## 2018-05-27 MED ORDER — PEG 3350-KCL-NA BICARB-NACL 420 G PO SOLR: 4000.0000 mL | Freq: Once | ORAL | 0 refills | Status: AC

## 2018-05-27 NOTE — Progress Notes (Signed)
Pre-visit done via phone with patient. Patient denies any allergies to eggs or soy. Patient denies any problems with anesthesia/sedation. Patient denies any oxygen use at home. Patient denies taking any diet/weight loss medications or blood thinners. EMMI education assisgned to patient on colonoscopy, this was explained and instructions given to patient. Instructions mailed to patient.

## 2018-06-01 MED FILL — PEG-3350 AND ELECTROLYTES S: 236 | 1 days supply | Qty: 4000 | Fill #0

## 2018-06-07 ENCOUNTER — Telehealth: Payer: Self-pay | Admitting: *Deleted

## 2018-06-07 NOTE — Telephone Encounter (Signed)
Covid-19 travel screening questions  Have you traveled in the last 14 days? no If yes where?  Do you now or have you had a fever in the last 14 days? no  Do you have any respiratory symptoms of shortness of breath or cough now or in the last 14 days? no  Do you have any family members or close contacts with diagnosed or suspected Covid-19? No  Pt is aware that care partner will be waiting in the car.  She will wear a mask into building       

## 2018-06-09 ENCOUNTER — Other Ambulatory Visit: Payer: Self-pay

## 2018-06-09 ENCOUNTER — Encounter: Payer: 59 | Admitting: Gastroenterology

## 2018-06-09 ENCOUNTER — Ambulatory Visit (AMBULATORY_SURGERY_CENTER): Payer: 59 | Admitting: Gastroenterology

## 2018-06-09 ENCOUNTER — Encounter: Payer: Self-pay | Admitting: Gastroenterology

## 2018-06-09 VITALS — BP 94/46 | HR 61 | Temp 98.6°F | Resp 13 | Ht 61.0 in | Wt 110.0 lb

## 2018-06-09 DIAGNOSIS — Z800 Family history of malignant neoplasm of digestive organs: Secondary | ICD-10-CM

## 2018-06-09 DIAGNOSIS — K621 Rectal polyp: Secondary | ICD-10-CM

## 2018-06-09 DIAGNOSIS — Z1211 Encounter for screening for malignant neoplasm of colon: Secondary | ICD-10-CM | POA: Diagnosis not present

## 2018-06-09 DIAGNOSIS — D128 Benign neoplasm of rectum: Secondary | ICD-10-CM

## 2018-06-09 MED ORDER — SODIUM CHLORIDE 0.9 % IV SOLN: 500.0000 mL | Freq: Once | INTRAVENOUS | Status: DC

## 2018-06-09 NOTE — Op Note (Signed)
[facility] Patient Name: [name] Procedure Date: 06/09/2018 12:50 PM MRN: [id number] Endoscopist: Milus Banister , MD Age: 43 Referring MD:  Date of Birth: [birth year] Gender: Female Account #: [id number] Procedure:                Colonoscopy Indications:              Screening in patient at increased risk: Family                            history of 1st-degree relative with colorectal                            cancer before age 9 years (mother in her 61s).                            Colonoscopy 2014 was normal. Medicines:                Monitored Anesthesia Care Procedure:                Pre-Anesthesia Assessment:                           - Prior to the procedure, a History and Physical                            was performed, and patient medications and                            allergies were reviewed. The patient's tolerance of                            previous anesthesia was also reviewed. The risks                            and benefits of the procedure and the sedation                            options and risks were discussed with the patient.                            All questions were answered, and informed consent                            was obtained. Prior Anticoagulants: The patient has                            taken no previous anticoagulant or antiplatelet                            agents. ASA Grade Assessment: II - A patient with                            mild systemic disease. After reviewing the risks  and benefits, the patient was deemed in                            satisfactory condition to undergo the procedure.                           After obtaining informed consent, the colonoscope                            was passed under direct vision. Throughout the                            procedure, the patient's blood pressure, pulse, and                            oxygen saturations were monitored  continuously. The                            Model PCF-[id number]) scope was introduced                            through the anus and advanced to the the cecum,                            identified by appendiceal orifice and ileocecal                            valve. The colonoscopy was performed without                            difficulty. The patient tolerated the procedure                            well. The quality of the bowel preparation was                            excellent. The ileocecal valve, appendiceal                            orifice, and rectum were photographed. Scope In: 1:09:27 PM Scope Out: 1:20:26 PM Scope Withdrawal Time: 0 hours 8 minutes 5 seconds  Total Procedure Duration: 0 hours 10 minutes 59 seconds  Findings:                 A 3 mm polyp was found in the rectum. The polyp was                            sessile. The polyp was removed with a cold snare.                            Resection and retrieval were complete.                           The exam was otherwise without abnormality on  direct and retroflexion views. Complications:            No immediate complications. Estimated blood loss:                            None. Estimated Blood Loss:     Estimated blood loss: none. Impression:               - One 3 mm polyp in the rectum, removed with a cold                            snare. Resected and retrieved.                           - The examination was otherwise normal on direct                            and retroflexion views. Recommendation:           - Patient has a contact number available for                            emergencies. The signs and symptoms of potential                            delayed complications were discussed with the                            patient. Return to normal activities tomorrow.                            Written discharge instructions were provided to the                             patient.                           - Resume previous diet.                           - Continue present medications.                           You will receive a letter within 2-3 weeks with the                            pathology results and my final recommendations.                            Likely you will need a repeat colonoscopy in 5                            years given your family history of colon cancer. Milus Banister, MD 06/09/2018 1:27:38 PM This report has been signed electronically.

## 2018-06-09 NOTE — Progress Notes (Signed)
Report to PACU, RN, vss, BBS= Clear.  

## 2018-06-09 NOTE — Progress Notes (Signed)
Called to room to assist during endoscopic procedure.  Patient ID and intended procedure confirmed with present staff. Received instructions for my participation in the procedure from the performing physician.  

## 2018-06-09 NOTE — Patient Instructions (Signed)
  Information on polyps given to you today  Await pathology results on polyp removed   YOU HAD AN ENDOSCOPIC PROCEDURE TODAY AT [facility]:   Refer to the procedure report that was given to you for any specific questions about what was found during the examination.  If the procedure report does not answer your questions, please call your gastroenterologist to clarify.  If you requested that your care partner not be given the details of your procedure findings, then the procedure report has been included in a sealed envelope for you to review at your convenience later.  YOU SHOULD EXPECT: Some feelings of bloating in the abdomen. Passage of more gas than usual.  Walking can help get rid of the air that was put into your GI tract during the procedure and reduce the bloating. If you had a lower endoscopy (such as a colonoscopy or flexible sigmoidoscopy) you may notice spotting of blood in your stool or on the toilet paper. If you underwent a bowel prep for your procedure, you may not have a normal bowel movement for a few days.  Please Note:  You might notice some irritation and congestion in your nose or some drainage.  This is from the oxygen used during your procedure.  There is no need for concern and it should clear up in a day or so.  SYMPTOMS TO REPORT IMMEDIATELY:   Following lower endoscopy (colonoscopy or flexible sigmoidoscopy):  Excessive amounts of blood in the stool  Significant tenderness or worsening of abdominal pains  Swelling of the abdomen that is new, acute  Fever of 100F or higher    For urgent or emergent issues, a gastroenterologist can be reached at any hour by calling [phone].   DIET:  We do recommend a small meal at first, but then you may proceed to your regular diet.  Drink plenty of fluids but you should avoid alcoholic beverages for 24 hours.  ACTIVITY:  You should plan to take it easy for the rest of today and you should NOT DRIVE or  use heavy machinery until tomorrow (because of the sedation medicines used during the test).    FOLLOW UP: Our staff will call the number listed on your records 48-72 hours following your procedure to check on you and address any questions or concerns that you may have regarding the information given to you following your procedure. If we do not reach you, we will leave a message.  We will attempt to reach you two times.  During this call, we will ask if you have developed any symptoms of COVID 19. If you develop any symptoms (for example fever, flu-like symptoms, shortness of breath, cough etc.) before then, please call [phone].  If any biopsies were taken you will be contacted by phone or by letter within the next 1-3 weeks.  Please call us at [phone] if you have not heard about the biopsies in 3 weeks.    SIGNATURES/CONFIDENTIALITY: You and/or your care partner have signed paperwork which will be entered into your electronic medical record.  These signatures attest to the fact that that the information above on your After Visit Summary has been reviewed and is understood.  Full responsibility of the confidentiality of this discharge information lies with you and/or your care-partner.

## 2018-06-11 ENCOUNTER — Telehealth: Payer: Self-pay

## 2018-06-11 NOTE — Telephone Encounter (Signed)
  Follow up Call-  Call back number 06/09/2018  Post procedure Call Back phone  # [phone]  Permission to leave phone message Yes  Some recent data might be hidden     Patient questions:  Do you have a fever, pain , or abdominal swelling? No. Pain Score  0 *  Have you tolerated food without any problems? Yes.    Have you been able to return to your normal activities? Yes.    Do you have any questions about your discharge instructions: Diet   No. Medications  No. Follow up visit  No.  Do you have questions or concerns about your Care? No.  Actions: * If pain score is 4 or above: No action needed, pain <4.   1. Have you developed a fever since your procedure? No.  2.   Have you had an respiratory symptoms (SOB or cough) since your procedure? No.  3.   Have you tested positive for COVID 19 since your procedure No.  3.   Have you had any family members/close contacts diagnosed with the COVID 19 since your procedure?  No   If any of these questions are a yes, please inquire if patient has been seen by family doctor and route this note to Joylene John, Therapist, sports.

## 2018-06-12 ENCOUNTER — Encounter: Payer: Self-pay | Admitting: Gastroenterology

## 2018-06-18 LAB — HM MAMMOGRAPHY

## 2018-06-25 ENCOUNTER — Encounter: Payer: Self-pay | Admitting: General Practice

## 2018-07-08 ENCOUNTER — Other Ambulatory Visit: Payer: Self-pay | Admitting: Obstetrics and Gynecology

## 2018-07-08 DIAGNOSIS — N6320 Unspecified lump in the left breast, unspecified quadrant: Secondary | ICD-10-CM

## 2018-07-09 ENCOUNTER — Other Ambulatory Visit: Payer: Self-pay | Admitting: Obstetrics and Gynecology

## 2018-07-10 ENCOUNTER — Other Ambulatory Visit: Payer: Self-pay | Admitting: Obstetrics and Gynecology

## 2018-07-10 DIAGNOSIS — N6320 Unspecified lump in the left breast, unspecified quadrant: Secondary | ICD-10-CM

## 2018-07-15 ENCOUNTER — Other Ambulatory Visit: Payer: 59

## 2019-09-21 ENCOUNTER — Other Ambulatory Visit: Payer: Self-pay | Admitting: *Deleted

## 2019-09-21 ENCOUNTER — Other Ambulatory Visit: Payer: 59

## 2019-09-21 ENCOUNTER — Other Ambulatory Visit: Payer: Self-pay

## 2019-09-21 DIAGNOSIS — Z20822 Contact with and (suspected) exposure to covid-19: Secondary | ICD-10-CM

## 2019-09-22 LAB — NOVEL CORONAVIRUS, NAA: SARS-CoV-2, NAA: NOT DETECTED

## 2019-09-27 ENCOUNTER — Other Ambulatory Visit: Payer: Self-pay | Admitting: Obstetrics & Gynecology

## 2019-09-27 MED ORDER — SULFAMETHOXAZOLE-TRIMETHOPRIM 800-160 MG PO TABS: 1.0000 | ORAL_TABLET | Freq: Two times a day (BID) | ORAL | 0 refills | Status: DC

## 2019-09-27 NOTE — Progress Notes (Signed)
Pt out of town and experiencing dysuria and suprapubic pain.  Pt feels that she as a UTI.  Rx Bactirm prescribed to [facility].

## 2020-07-11 LAB — HM MAMMOGRAPHY

## 2020-07-27 ENCOUNTER — Other Ambulatory Visit (HOSPITAL_COMMUNITY): Payer: Self-pay

## 2020-07-27 MED ORDER — CHOLECALCIFEROL 1.25 MG (50000 UT) PO CAPS
50000.0000 [IU] | ORAL_CAPSULE | ORAL | 0 refills | Status: DC
  Filled 2020-07-27 – 2020-08-04 (×5): qty 8, 56d supply, fill #0

## 2020-08-04 ENCOUNTER — Other Ambulatory Visit (HOSPITAL_COMMUNITY): Payer: Self-pay

## 2020-08-07 ENCOUNTER — Other Ambulatory Visit (HOSPITAL_COMMUNITY): Payer: Self-pay

## 2020-08-08 ENCOUNTER — Other Ambulatory Visit (HOSPITAL_COMMUNITY): Payer: Self-pay

## 2020-08-08 MED ORDER — ERGOCALCIFEROL 1.25 MG (50000 UT) PO CAPS
50000.0000 [IU] | ORAL_CAPSULE | ORAL | 0 refills | Status: AC
  Filled 2020-08-08 – 2020-08-24 (×2): qty 8, 56d supply, fill #0

## 2020-08-16 ENCOUNTER — Other Ambulatory Visit (HOSPITAL_COMMUNITY): Payer: Self-pay

## 2020-08-24 ENCOUNTER — Other Ambulatory Visit (HOSPITAL_COMMUNITY): Payer: Self-pay

## 2021-04-04 ENCOUNTER — Telehealth: Payer: Self-pay | Admitting: Family Medicine

## 2021-04-04 NOTE — Telephone Encounter (Signed)
Please schedule new patient/establish care visit with [name] - I have agreed to see her as PCP. Thanks.  ?

## 2021-04-25 ENCOUNTER — Other Ambulatory Visit (HOSPITAL_COMMUNITY): Payer: Self-pay

## 2021-05-09 ENCOUNTER — Other Ambulatory Visit (HOSPITAL_COMMUNITY): Payer: Self-pay

## 2021-05-30 ENCOUNTER — Ambulatory Visit (INDEPENDENT_AMBULATORY_CARE_PROVIDER_SITE_OTHER): Payer: 59 | Admitting: Family Medicine

## 2021-05-30 ENCOUNTER — Encounter: Payer: Self-pay | Admitting: Family Medicine

## 2021-05-30 VITALS — BP 122/74 | HR 72 | Temp 98.2°F | Resp 16 | Ht 61.0 in | Wt 126.2 lb

## 2021-05-30 DIAGNOSIS — Z1329 Encounter for screening for other suspected endocrine disorder: Secondary | ICD-10-CM

## 2021-05-30 DIAGNOSIS — Z800 Family history of malignant neoplasm of digestive organs: Secondary | ICD-10-CM

## 2021-05-30 DIAGNOSIS — M25461 Effusion, right knee: Secondary | ICD-10-CM

## 2021-05-30 DIAGNOSIS — Z131 Encounter for screening for diabetes mellitus: Secondary | ICD-10-CM | POA: Diagnosis not present

## 2021-05-30 DIAGNOSIS — Z808 Family history of malignant neoplasm of other organs or systems: Secondary | ICD-10-CM

## 2021-05-30 DIAGNOSIS — Z13220 Encounter for screening for lipoid disorders: Secondary | ICD-10-CM

## 2021-05-30 NOTE — Progress Notes (Signed)
? ?Subjective:  ?Patient ID: [name], female    DOB: [birth year]  Age: 46 y.o. MRN: [id number] ? ?CC:  ?Chief Complaint  ?Patient presents with  ? New Patient (Initial Visit)  ?  Pt here for establish care, notes was previously Dr Birdie Riddle pt   ? ? ?HPI ?[name] presents for  ? ?New pt to establish care. Prior pt of Dr. Birdie Riddle.  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  ?OB - Dr. Cletis Media, has checked labs, MMG, pap recently - in past year.  ?FH of colon CA (mother)- regular colonoscopies - Dr. Ardis Hughs.  ?Mother with thyroid CA, younger sister with thyroid CA (found on routine [place] in [place]) s/p thyroidectomy.  ?Thyroid labs normal in past - in past year. Appt with GYN in June.  ?No Rx meds.  ? ?Hx of hypermobility of joints, Beighton score 6 in 2015. No recent shoulder, elbow laxity,dislocation.  ?  ?R knee swelling: ?Noted in 02/2021. Diffuse swelling,no redness/warmth, no fever or difficulty bending. No preceding injury. Noted after using Peloton. No problems with running. Still running. Minimal. No more Peloton.  ?Still remains swollen, intermittent swelling that worsens with standing. No locking/giving way. No pain, just swelling.  ?Had PT x4 few months.   ?Compression sleeve, ice at times. No meds.  ? ?Similar issue in left knee about 7 years ago. Steroid injection improved sx's, no recurrence.  ? ? ?History ?Patient Active Problem List  ? Diagnosis Date Noted  ? 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 Routine general medical examination at a health care facility 11/05/2012  ? Personal history of colonic polyps 08/31/2012  ? Bronchitis 06/07/2010  ? SINUSITIS - ACUTE-NOS 02/27/2010  ? THROAT PAIN 06/17/2008  ? ?History reviewed. No pertinent past medical history. ?Past Surgical History:  ?Procedure Laterality Date  ? COLONOSCOPY  2010, 09/22/2012  ? ?No Known Allergies ?Prior to Admission medications   ?Medication Sig Start Date End Date Taking? Authorizing  Provider  ?Ascorbic Acid (VITAMIN C PO) Take by mouth. ?Patient not taking: Reported on 05/30/2021    [provider]  ?diclofenac sodium (VOLTAREN) 1 % GEL Apply 2 g topically 4 (four) times daily. To right ankle ?Patient not taking: Reported on 05/27/2018 03/28/14   Bensimhon, Shaune Pascal, MD  ?ergocalciferol (VITAMIN D2) 1.25 MG (50000 UT) capsule Take 1 capsule (50,000 Units total) by mouth once a week. ?Patient not taking: Reported on 05/30/2021 08/08/20     ?Multiple Vitamins-Minerals (MULTIVITAMIN WITH MINERALS) tablet Take 1 tablet by mouth daily. ?Patient not taking: Reported on 05/30/2021    [provider]  ?sulfamethoxazole-trimethoprim (BACTRIM DS) 800-160 MG tablet Take 1 tablet by mouth 2 (two) times daily. ?Patient not taking: Reported on 05/30/2021 09/27/19   Guss Bunde, MD  ?VITAMIN D PO Take by mouth. ?Patient not taking: Reported on 05/30/2021    [provider]  ? ?Social History  ? ?Socioeconomic History  ? Marital status: Married  ?  Spouse name: Not on file  ? Number of children: 3  ? Years of education: Not on file  ? Highest education level: Not on file  ?Occupational History  ? Occupation: Software engineer  ?  Employer: [employer]  ?Tobacco Use  ? Smoking status: Never  ? Smokeless tobacco: Never  ?Vaping Use  ? Vaping Use: Never used  ?Substance and Sexual Activity  ? Alcohol use: Yes  ?  Alcohol/week: 7.0 standard drinks  ?  Types: 7  Glasses of wine per week  ?  Comment: 7 glasses of wine per week, social  ? Drug use: No  ? Sexual activity: Yes  ?  Birth control/protection: Condom  ?Other Topics Concern  ? Not on file  ?Social History Narrative  ? Not on file  ? ?Social Determinants of Health  ? ?Financial Resource Strain: Not on file  ?Food Insecurity: Not on file  ?Transportation Needs: Not on file  ?Physical Activity: Not on file  ?Stress: Not on file  ?Social Connections: Not on file  ?Intimate Partner Violence: Not on file  ? ? ?Review of Systems ?Per HPI ? ?Objective:   ? ?Vitals:  ? 05/30/21 1532  ?BP: 122/74  ?Pulse: 72  ?Resp: 16  ?Temp: 98.2 ?F (36.8 ?C)  ?TempSrc: Temporal  ?SpO2: 98%  ?Weight: 126 lb 3.2 oz (57.2 kg)  ?Height: '5\' 1"'$  (1.549 m)  ? ? ? ?Physical Exam ?Vitals reviewed.  ?Constitutional:   ?   General: She is not in acute distress. ?   Appearance: Normal appearance. She is well-developed.  ?HENT:  ?   Head: Normocephalic and atraumatic.  ?Neck:  ?   Comments: No appreciable thyromegaly or mass/nodule noted ?Cardiovascular:  ?   Rate and Rhythm: Normal rate and regular rhythm.  ?Pulmonary:  ?   Effort: Pulmonary effort is normal.  ?   Breath sounds: Normal breath sounds.  ?Musculoskeletal:  ?   Cervical back: Normal range of motion and neck supple.  ?   Comments: Left knee, skin intact, no wounds, no erythema.  No appreciable effusion.  Pain-free range of motion, FROM.  No focal bony tenderness or joint line tenderness.  No J sign, negative patellar grind, negative Lachman, McMurray, and no pain or significant laxity with varus/valgus testing. ? ?Right knee: Skin intact, no wounds/erythema.  Approximately 1+ effusion.  Slight discomfort at terminal flexion, full extension.  No focal bony tenderness.  No joint line tenderness.  Negative patellar grind, no J sign.  Negative Lachman, negative McMurray, no appreciable laxity with varus/valgus testing.  ?Lymphadenopathy:  ?   Cervical: No cervical adenopathy.  ?Neurological:  ?   Mental Status: She is alert and oriented to person, place, and time.  ?Psychiatric:     ?   Mood and Affect: Mood normal.  ? ? ? ?Assessment & Plan:  ?[name] is a 46 y.o. female . ?Swelling of right knee joint - Plan: DG Knee Complete 4 Views Right ? -Persistent effusion with intermittent worsening, more with prolonged weightbearing/standing.  No known injury.  Likely component of hypermobility syndrome with previous Beighton score greater than 6.  Has not had recurrent joint issues, testing for Erler's Danlos deferred  previously. ? -Initially check imaging with x-ray to evaluate for degenerative changes of joint.  Consider referral for sports med musculoskeletal ultrasound depending on results.  Without significant pain decided against injection at this time. ? -Okay to continue exercise as tolerated with low impact exercise, biking only if better fit as question fit of Peloton at home as other family members also using. ? -RTC precautions. ? ?Screening for diabetes mellitus - Plan: Comprehensive metabolic panel ? ?Screening for thyroid disorder - Plan: TSH ?Family history of thyroid cancer - Plan: Ambulatory referral to Genetics ?Family history of colon cancer - Plan: Ambulatory referral to Genetics ? -Referred to genetic counselor to discuss the colon and thyroid cancer in her mother, thyroid cancer in her sister, especially as sister's cancer was detected with routine  ultrasound, and reportedly normal thyroid testing.  We will check TSH, consider ultrasound thyroid but hopefully genetic counseling will shed some light on recommended screening and intervals. ? ?Screening for hyperlipidemia - Plan: Comprehensive metabolic panel, Lipid panel ? -Fasting labs planned at [facility] ? ?No orders of the defined types were placed in this encounter. ? ?Patient Instructions  ?[facility] for fasting labs, xray at same building.  ?Walk in 8:30-4:30 during weekdays, no appointment needed ?[place]  ?[place], [place] [address] ? ?Depending on xray of knee, can decide next step.  ? ? ? ? ? ?Signed,  ? ?Merri Ray, MD ?[place], [facility] ?[facility] ?05/30/21 ?5:56 PM ? ? ?

## 2021-05-30 NOTE — Patient Instructions (Addendum)
[facility] for fasting labs, xray at same building.  ?Walk in 8:30-4:30 during weekdays, no appointment needed ?[place]  ?[place], [place] [address] ? ?Depending on xray of knee, can decide next step.  ? ? ?

## 2021-06-04 ENCOUNTER — Telehealth: Payer: Self-pay | Admitting: Genetic Counselor

## 2021-06-04 NOTE — Telephone Encounter (Signed)
Scheduled appt per 5/10 referral. Pt is aware of appt date and time. Pt is aware to arrive 15 mins prior to appt time and to bring and updated insurance card. Pt is aware of appt location.   ?

## 2021-06-12 ENCOUNTER — Encounter: Payer: Self-pay | Admitting: Family Medicine

## 2021-06-12 ENCOUNTER — Other Ambulatory Visit (INDEPENDENT_AMBULATORY_CARE_PROVIDER_SITE_OTHER): Payer: 59

## 2021-06-12 ENCOUNTER — Ambulatory Visit (INDEPENDENT_AMBULATORY_CARE_PROVIDER_SITE_OTHER)
Admission: RE | Admit: 2021-06-12 | Discharge: 2021-06-12 | Disposition: A | Discharge status: Home / Self Care | Payer: 59 | Source: Ambulatory Visit | Attending: Family Medicine | Admitting: Family Medicine

## 2021-06-12 DIAGNOSIS — M25461 Effusion, right knee: Secondary | ICD-10-CM | POA: Diagnosis not present

## 2021-06-12 DIAGNOSIS — Z13220 Encounter for screening for lipoid disorders: Secondary | ICD-10-CM

## 2021-06-12 DIAGNOSIS — Z1329 Encounter for screening for other suspected endocrine disorder: Secondary | ICD-10-CM | POA: Diagnosis not present

## 2021-06-12 DIAGNOSIS — Z131 Encounter for screening for diabetes mellitus: Secondary | ICD-10-CM

## 2021-06-12 LAB — COMPREHENSIVE METABOLIC PANEL
ALT: 14 U/L (ref 0–35)
AST: 16 U/L (ref 0–37)
Albumin: 4.8 g/dL (ref 3.5–5.2)
Alkaline Phosphatase: 52 U/L (ref 39–117)
BUN: 15 mg/dL (ref 6–23)
CO2: 29 mEq/L (ref 19–32)
Calcium: 9.7 mg/dL (ref 8.4–10.5)
Chloride: 101 mEq/L (ref 96–112)
Creatinine, Ser: 0.77 mg/dL (ref 0.40–1.20)
GFR: 92.05 mL/min (ref >60.00)
Glucose, Bld: 87 mg/dL (ref 70–99)
Potassium: 3.9 mEq/L (ref 3.5–5.1)
Sodium: 138 mEq/L (ref 135–145)
Total Bilirubin: 0.6 mg/dL (ref 0.2–1.2)
Total Protein: 7.7 g/dL (ref 6.0–8.3)

## 2021-06-12 LAB — TSH: TSH: 3.72 u[IU]/mL (ref 0.35–5.50)

## 2021-06-12 LAB — LIPID PANEL
Cholesterol: 186 mg/dL (ref 0–200)
HDL: 72.3 mg/dL (ref >39.00)
LDL Cholesterol: 102 mg/dL — ABNORMAL HIGH (ref 0–99)
NonHDL: 113.98
Total CHOL/HDL Ratio: 3
Triglycerides: 61 mg/dL (ref 0.0–149.0)
VLDL: 12.2 mg/dL (ref 0.0–40.0)

## 2021-06-12 NOTE — Telephone Encounter (Signed)
Pt had Xray done today  Please advise

## 2021-06-21 ENCOUNTER — Encounter: Payer: Self-pay | Admitting: Family Medicine

## 2021-06-22 NOTE — Telephone Encounter (Signed)
Pt has responded to follow up on XR reports please advise

## 2021-07-19 ENCOUNTER — Inpatient Hospital Stay: Payer: 59 | Attending: Genetic Counselor | Admitting: Genetic Counselor

## 2021-07-19 ENCOUNTER — Other Ambulatory Visit: Payer: Self-pay | Admitting: Genetic Counselor

## 2021-07-19 ENCOUNTER — Inpatient Hospital Stay: Payer: 59

## 2021-07-19 DIAGNOSIS — Z800 Family history of malignant neoplasm of digestive organs: Secondary | ICD-10-CM

## 2021-07-19 DIAGNOSIS — Z808 Family history of malignant neoplasm of other organs or systems: Secondary | ICD-10-CM

## 2021-07-19 NOTE — Progress Notes (Deleted)
REFERRING PROVIDER: Wendie Agreste, MD [address] [place],  [place] [address]  PRIMARY PROVIDER:  Wendie Agreste, MD  PRIMARY REASON FOR VISIT:  No diagnosis found.  HISTORY OF PRESENT ILLNESS:   Ms. Hietpas, a 46 y.o. female, was seen for a [facility] cancer genetics consultation at the request of Dr. Carlota Raspberry due to a {Personal/family:20331} history of {cancer/polyps}.  Ms. Cuervo presents to clinic today to discuss the possibility of a hereditary predisposition to cancer, to discuss genetic testing, and to further clarify her future cancer risks, as well as potential cancer risks for family members.   In ***, at the age of ***, Ms. Quesenberry was diagnosed with {CA PATHOLOGY:63853} of the {right left (wildcard):15202} {CA UEAVW:09811}. The treatment plan ***.    *** Ms. Rockwood is a 46 y.o. female with no personal history of cancer.    CANCER HISTORY:  Oncology History   No history exists.     RISK FACTORS:  Mammogram within the last year: yes Number of breast biopsies: {Numbers 1-12 multi-select:20307}. Colonoscopy: yes;  most recent in 2020; f/u in 5 years; history of two hyperplastic polyps . Hysterectomy: {Yes/No-Ex:120004}.  Ovaries intact: {Yes/No-Ex:120004}.  Menarche was at age ***.  First live birth at age ***.  Menopausal status: {Menopause:31378}.  OCP use for approximately {Numbers 1-12 multi-select:20307} years.  HRT use: {Numbers 1-12 multi-select:20307} years. Any excessive radiation exposure in the past: {Yes/No-Ex:120004} Dermatology: ***  No past medical history on file.  Past Surgical History:  Procedure Laterality Date   COLONOSCOPY  2010, 09/22/2012    Social History   Socioeconomic History   Marital status: Married    Spouse name: Not on file   Number of children: 3   Years of education: Not on file   Highest education level: Not on file  Occupational History   Occupation: Pharmacist    Employer: [employer]  Tobacco Use   Smoking  status: Never   Smokeless tobacco: Never  Vaping Use   Vaping Use: Never used  Substance and Sexual Activity   Alcohol use: Yes    Alcohol/week: 7.0 standard drinks of alcohol    Types: 7 Glasses of wine per week    Comment: 7 glasses of wine per week, social   Drug use: No   Sexual activity: Yes    Birth control/protection: Condom  Other Topics Concern   Not on file  Social History Narrative   Not on file   Social Determinants of Health   Financial Resource Strain: Not on file  Food Insecurity: Not on file  Transportation Needs: Not on file  Physical Activity: Not on file  Stress: Not on file  Social Connections: Not on file     FAMILY HISTORY:  We obtained a detailed, 4-generation family history.  Significant diagnoses are listed below: Family History  Problem Relation Age of Onset   Colon cancer Mother 64   Thyroid cancer Mother    Colon polyps Sister        pre-cancerous   Thyroid cancer Sister    Colon polyps Sister    Colon cancer Maternal Grandmother 49   Hypertension Maternal Grandfather    Diabetes Maternal Grandfather    Coronary artery disease Paternal Grandmother    Stroke Paternal Grandmother    Esophageal cancer Neg Hx    Rectal cancer Neg Hx    Stomach cancer Neg Hx     Ms. Castronova is {aware/unaware} of previous family history of genetic testing  for hereditary cancer risks. Patient's maternal ancestors are of *** descent, and paternal ancestors are of *** descent. There {IS NO:12509} reported Ashkenazi Jewish ancestry. There {IS NO:12509} known consanguinity.  GENETIC COUNSELING ASSESSMENT: Ms. Treadwell is a 46 y.o. female with a {Personal/family:20331} history of {cancer/polyps} which is somewhat suggestive of a {DISEASE} and predisposition to cancer given ***. We, therefore, discussed and recommended the following at today's visit.   DISCUSSION: We discussed that *** - ***% of *** is hereditary, with most cases of hereditary *** cancer associated with  ***.  There are other genes that can be associated with hereditary *** cancer syndromes.  These include ***.  We discussed that testing is beneficial for several reasons, including knowing about other cancer risks, identifying potential screening and risk-reduction options that may be appropriate, and to understanding if other family members could be at risk for cancer and allowing them to undergo genetic testing.  We reviewed the characteristics, features and inheritance patterns of hereditary cancer syndromes. We also discussed genetic testing, including the appropriate family members to test, the process of testing, insurance coverage and turn-around-time for results. We discussed the implications of a negative, positive, carrier and/or variant of uncertain significant result. We discussed that negative results would be uninformative given that Ms. Montenegro does not have a personal history of cancer. We recommended Ms. Levay pursue genetic testing for a panel that contains genes associated with ***.  Ms. Crowl was offered a common hereditary cancer panel (48 genes) and an expanded pan-cancer panel (85 genes). Ms. Leiva was informed of the benefits and limitations of each panel, including that expanded pan-cancer panels contain several genes that do not have clear management guidelines at this point in time.  We also discussed that as the number of genes included on a panel increases, the chances of variants of uncertain significance increases.  After considering the benefits and limitations of each gene panel, Ms. Drinkard elected to have an *** through ***.   Based on Ms. Veals's {Personal/family:20331} history of cancer, she meets medical criteria for genetic testing. Despite that she meets criteria, she may still have an out of pocket cost. We discussed that if her out of pocket cost for testing is over $100, the laboratory should contact them to discuss self-pay options and/or patient pay assistance  programs.   ***We reviewed the characteristics, features and inheritance patterns of hereditary cancer syndromes. We also discussed genetic testing, including the appropriate family members to test, the process of testing, insurance coverage and turn-around-time for results. We discussed the implications of a negative, positive and/or variant of uncertain significant result. In order to get genetic test results in a timely manner so that Ms. Faulkenberry can use these genetic test results for surgical decisions, we recommended Ms. Dampier pursue genetic testing for the ***. Once complete, we recommend Ms. Demond pursue reflex genetic testing to the *** gene panel.   Based on Ms. Wichmann's {Personal/family:20331} history of cancer, she meets medical criteria for genetic testing. Despite that she meets criteria, she may still have an out of pocket cost.   ***We discussed with Ms. Forde that the {Personal/family:20331} history does not meet insurance or NCCN criteria for genetic testing and, therefore, is not highly consistent with a familial hereditary cancer syndrome.  We feel she is at low risk to harbor a gene mutation associated with such a condition. Thus, we did not recommend any genetic testing, at this time, and recommended Ms. Mchaney continue to follow the cancer screening  guidelines given by her primary healthcare provider.  ***In order to estimate her chance of having a {CA GENE:62345} mutation, we used statistical models ({GENMODELS:62370}) that consider her personal medical history, family history and ancestry.  Because each model is different, there can be a lot of variability in the risks they give.  Therefore, these numbers must be considered a rough range and not a precise risk of having a {CA GENE:62345} mutation.  These models estimate that she has approximately a ***-***% chance of having a mutation. Based on this assessment of her family and personal history, genetic testing {IS/ISNOT:34056}  recommended.  ***Based on the patient's {Personal/family:20331} history, a statistical model ({GENMODELS:62370}) was used to estimate her risk of developing {CA HX:54794}. This estimates her lifetime risk of developing {CA HX:54794} to be approximately ***%. This estimation does not consider any genetic testing results.  The patient's lifetime breast cancer risk is a preliminary estimate based on available information using one of several models endorsed by the [place] (ACS). The ACS recommends consideration of breast MRI screening as an adjunct to mammography for patients at high risk (defined as 20% or greater lifetime risk).   ***Ms. Fehr has been determined to be at high risk for breast cancer.  Therefore, we recommend that annual screening with mammography and breast MRI be performed.  ***begin at age 68, or 10 years prior to the age of breast cancer diagnosis in a relative (whichever is earlier).  We discussed that Ms. Fredlund should discuss her individual situation with her referring physician and determine a breast cancer screening plan with which they are both comfortable.    We discussed that some people do not want to undergo genetic testing due to fear of genetic discrimination.  A federal law called the Genetic Information Non-Discrimination Act (GINA) of 2008 helps protect individuals against genetic discrimination based on their genetic test results.  It impacts both health insurance and employment.  With health insurance, it protects against increased premiums, being kicked off insurance or being forced to take a test in order to be insured.  For employment it protects against hiring, firing and promoting decisions based on genetic test results.  GINA does not apply to those in the [employer], those who work for companies with less than 15 employees, and new life insurance or long-term disability insurance policies.  Health status due to a cancer diagnosis is not protected under  GINA.  PLAN: After considering the risks, benefits, and limitations, Ms. Shipman provided informed consent to pursue genetic testing and the blood sample was sent to {Lab} Laboratories for analysis of the {test}. Results should be available within approximately {TAT TIME} weeks' time, at which point they will be disclosed by telephone to Ms. Dimaio, as will any additional recommendations warranted by these results. Ms. Pagett will receive a summary of her genetic counseling visit and a copy of her results once available. This information will also be available in Epic.   *** Despite our recommendation, Ms. Benefiel did not wish to pursue genetic testing at today's visit. We understand this decision and remain available to coordinate genetic testing at any time in the future. We, therefore, recommend Ms. Abeln continue to follow the cancer screening guidelines given by her primary healthcare provider.  ***Based on Ms. Rudzinski's family history, we recommended her ***, who was diagnosed with *** at age ***, have genetic counseling and testing. Ms. Scheller will let us know if we can be of any assistance in coordinating genetic counseling  and/or testing for this family member.   Lastly, we encouraged Ms. Borunda to remain in contact with cancer genetics annually so that we can continuously update the family history and inform her of any changes in cancer genetics and testing that may be of benefit for this family.   Ms. Swift questions were answered to her satisfaction today. Our contact information was provided should additional questions or concerns arise. Thank you for the referral and allowing [place] to share in the care of your patient.   Rahim Astorga M. Joette Catching, [place], [facility] Genetic Counselor [email or web address] (P) [phone]   The patient was seen for a total of *** minutes in face-to-face genetic counseling.  ***The was patient was accompanied by ***.  ***The patient was seen alone.  Drs. Lindi Adie and/or  Burr Medico were available to discuss this case as needed.  _______________________________________________________________________ For Office Staff:  Number of people involved in session: *** Was an Intern/ student involved with case: {YES/NO:63}

## 2021-07-19 NOTE — Addendum Note (Signed)
Addended by: Ignacia Bayley on: 07/19/2021 10:32 AM   Modules accepted: Orders

## 2021-07-23 ENCOUNTER — Encounter: Payer: Self-pay | Admitting: Sports Medicine

## 2021-07-23 ENCOUNTER — Telehealth: Payer: Self-pay | Admitting: Licensed Clinical Social Worker

## 2021-07-23 ENCOUNTER — Ambulatory Visit (INDEPENDENT_AMBULATORY_CARE_PROVIDER_SITE_OTHER): Payer: 59 | Admitting: Sports Medicine

## 2021-07-23 DIAGNOSIS — M25469 Effusion, unspecified knee: Secondary | ICD-10-CM

## 2021-07-23 DIAGNOSIS — M25461 Effusion, right knee: Secondary | ICD-10-CM

## 2021-07-23 MED ORDER — METHYLPREDNISOLONE ACETATE 40 MG/ML IJ SUSP
40.0000 mg | Freq: Once | INTRAMUSCULAR | Status: AC
  Administered 2021-07-23: 40 mg via INTRA_ARTICULAR

## 2021-07-23 NOTE — Progress Notes (Signed)
    SUBJECTIVE:   CHIEF COMPLAINT / HPI:   Patient presents for R knee swelling.   In November stopped runing as much and did Optometrist. Mid Feb knee was so swollen could hardly stand on her knee. No injury or truama. Denies pain though does have some discomfort when going down stairs or if she's running and has to stop abruptly. Denies locking. Swelling intermittent. Wraps knee with ace bandage, will ice and take occasional NSAID with short term improvement in swelling. Tried physical therapy. Able to run 2 miles here and there without issues. End of may was doing 3-4 miles and then several days later with significant swelling.   Saw PCP for this swelling on 5/10 and Xray was ordered which did not show fracture or dislocation. Small effusion is present in the suprapatellar bursa. Mild degenerative changes are noted with tiny bony spurs.  OBJECTIVE:   BP 98/68 (BP Location: Left Arm, Patient Position: Sitting, Cuff Size: Normal)   Ht 5\' 1"  (1.549 m)   Wt 120 lb (54.4 kg)   BMI 22.67 kg/m    General: alert, pleasant, NAD Resp: Normal WOB R Knee: - Inspection: no gross deformity b/l. Effusion noted supra patellar, No erythema or bruising b/l. Skin intact - Palpation: no TTP b/l - ROM: full active ROM with flexion and extension in knee and hip b/l - Strength: 5/5 strength b/l - Neuro/vasc: NV intact distally b/l - Special Tests: - LIGAMENTS: negative anterior and posterior drawer, negative Lachman's, no MCL or LCL laxity  -- MENISCUS: negative McMurray's, negative Thessaly  -- PF JOINT: nml patellar mobility bilaterally. negative patellar grind, negative patellar apprehension   ASSESSMENT/PLAN:   Edema of knee Patient has been experiencing intermittent R knee swelling for the past 5 months, worsening recently. Prior XR without fracture but with mild degenerative changes. Ultrasound performed today which showed effusion and a small bone spur. Performed steroid injection (see procedure  note). Recommended compression sleeve to help with swelling. Icing, elevation, as well as NSAID for 1 week.     Procedure performed: knee intraarticular corticosteroid injection; palpation guided  Consent obtained and verified. Time-out conducted. Noted no overlying erythema, induration, or other signs of local infection. The R medial joint space was palpated and marked. The overlying skin was prepped in a sterile fashion. Topical analgesic spray: Ethyl chloride. Needle: 25 gauge, 1.5 inch Completed without difficulty. Meds: 40 mg methylrprednisolone, 4 ml 1% lidocaine without epinephrine    Cora Collum, DO [place] [facility]    Patient seen and evaluated with the resident.  I agree with the above plan of care.  Cortisone injection administered as above.  She may continue with activity as tolerated.  If symptoms persist for another 3 to 4 weeks despite today's injection then I would recommend further diagnostic imaging in the form of an MRI.  She will follow-up for ongoing or recalcitrant issues.

## 2021-07-23 NOTE — Assessment & Plan Note (Signed)
Patient has been experiencing intermittent R knee swelling for the past 5 months, worsening recently. Prior XR without fracture but with mild degenerative changes. Ultrasound performed today which showed effusion and a small bone spur. Performed steroid injection (see procedure note). Recommended compression sleeve to help with swelling. Icing, elevation, as well as NSAID for 1 week.

## 2021-07-23 NOTE — Telephone Encounter (Signed)
R/s pt's genetics appt. Pt is aware.  

## 2021-09-17 ENCOUNTER — Inpatient Hospital Stay: Payer: 59

## 2021-09-17 ENCOUNTER — Other Ambulatory Visit: Payer: Self-pay

## 2021-09-17 ENCOUNTER — Encounter: Payer: Self-pay | Admitting: Licensed Clinical Social Worker

## 2021-09-17 ENCOUNTER — Inpatient Hospital Stay: Payer: 59 | Attending: Genetic Counselor | Admitting: Licensed Clinical Social Worker

## 2021-09-17 DIAGNOSIS — Z8371 Family history of colonic polyps: Secondary | ICD-10-CM

## 2021-09-17 DIAGNOSIS — Z800 Family history of malignant neoplasm of digestive organs: Secondary | ICD-10-CM | POA: Diagnosis not present

## 2021-09-17 DIAGNOSIS — Z808 Family history of malignant neoplasm of other organs or systems: Secondary | ICD-10-CM | POA: Diagnosis not present

## 2021-09-17 NOTE — Progress Notes (Signed)
REFERRING PROVIDER: Wendie Agreste, MD [address] [place],  [place] [address]  PRIMARY PROVIDER:  Wendie Agreste, MD  PRIMARY REASON FOR VISIT:  1. Family history of colon cancer   2. Family history of colonic polyps   3. Family history of thyroid cancer      HISTORY OF PRESENT ILLNESS:   [name], a 46 y.o. female, was seen for a [facility] cancer genetics consultation at the request of Dr. Carlota Raspberry due to a family history of colon cancer and thyroid cancer.  [name] presents to clinic today to discuss the possibility of a hereditary predisposition to cancer, genetic testing, and to further clarify her future cancer risks, as well as potential cancer risks for family members.   CANCER HISTORY:  [name] is a 46 y.o. female with no personal history of cancer.    RISK FACTORS:  Menarche was at age 69.  First live birth at age 25.  OCP use for approximately 0 years.  Ovaries intact: yes.  Hysterectomy: no.  Menopausal status: premenopausal.  HRT use: 0 years. Colonoscopy: yes;  2 polyps . Mammogram within the last year: yes. Number of breast biopsies: 0. Up to date with pelvic exams: yes. Any excessive radiation exposure in the past: no  Past Surgical History:  Procedure Laterality Date   COLONOSCOPY  2010, 09/22/2012    FAMILY HISTORY:  We obtained a detailed, 4-generation family history.  Significant diagnoses are listed below: Family History  Problem Relation Age of Onset   Colon cancer Mother 27   Thyroid cancer Mother 80   Colon polyps Sister        pre-cancerous   Thyroid cancer Sister        81; hx genetic testing "inconclusive"   Colon polyps Sister    Colon cancer Maternal Grandmother 51   Hypertension Maternal Grandfather    Diabetes Maternal Grandfather    Coronary artery disease Paternal Grandmother    Stroke Paternal Grandmother    Esophageal cancer Neg Hx    Rectal cancer Neg Hx    Stomach cancer Neg Hx    [name] has 2 sons, 4  and 64, and 1 daughter, 10. She has 2 sisters, both have had >10 colon polyps. One sister also had thyroid cancer (unsure type) at 1 detected on a thyroid ultrasound while she was in [place]. This sister may have had genetic testing in the past that was inconclusive.  [name] mother had colon cancer at 59 , thyroid cancer (unsure type) right after her colon cancer, and is living at 2. Maternal grandmother also had colon cancer, diagnosed in her 1s.   [name] father is living at 90 and there is no known cancer on his side of the family.  [name] is unaware of previous family history of genetic testing for hereditary cancer risks. There is no reported Ashkenazi Jewish ancestry. There is no known consanguinity.   GENETIC COUNSELING ASSESSMENT: [name] is a 46 y.o. female with a family history which is somewhat suggestive of a hereditary cancer syndrome and predisposition to cancer. We, therefore, discussed and recommended the following at today's visit.   DISCUSSION: We discussed that approximately 10% of colorectal cancer is hereditary. Most cases of hereditary colorectal cancer are associated with Lynch syndrome genes, although there are other genes associated with hereditary colorectal cancer and hereditary thyroid cancer as well. We discussed that if she finds out the type of thyroid cancer her mother  and sister had, it may increase/decrease our suspicion for hereditary cancer. Cancers and risks are gene specific. We discussed that testing is beneficial for several reasons including knowing about cancer risks, identifying potential screening and risk-reduction options that may be appropriate, and to understand if other family members could be at risk for cancer and allow them to undergo genetic testing.   We reviewed the characteristics, features and inheritance patterns of hereditary cancer syndromes. We also discussed genetic testing, including the appropriate family members to  test, the process of testing, insurance coverage and turn-around-time for results. We discussed the implications of a negative, positive and/or variant of uncertain significant result. We recommended [name] pursue genetic testing for the [facility] CancerNext-Expanded+RNA gene panel.   Based on [name]'s family history of cancer and polyps, she meets medical criteria for genetic testing. Despite that she meets criteria, she may still have an out of pocket cost. We discussed that if her out of pocket cost for testing is over $100, the laboratory will call and confirm whether she wants to proceed with testing.  If the out of pocket cost of testing is less than $100 she will be billed by the genetic testing laboratory.   We discussed that some people do not want to undergo genetic testing due to fear of genetic discrimination.  A federal law called the Genetic Information Non-Discrimination Act (GINA) of 2008 helps protect individuals against genetic discrimination based on their genetic test results.  It impacts both health insurance and employment.  For health insurance, it protects against increased premiums, being kicked off insurance or being forced to take a test in order to be insured.  For employment it protects against hiring, firing and promoting decisions based on genetic test results.  Health status due to a cancer diagnosis is not protected under GINA.  This law does not protect life insurance, disability insurance, or other types of insurance.   PLAN: After considering the risks, benefits, and limitations,  [name] did not wish to pursue genetic testing at today's visit. We understand this decision and remain available to coordinate genetic testing at any time in the future. We, therefore, recommend [name] continue to follow the cancer screening guidelines given by her primary healthcare provider.  [name] questions were answered to her satisfaction today. Our contact information was  provided should additional questions or concerns arise. Thank you for the referral and allowing [place] to share in the care of your patient.   Faith Rogue, MS, [facility] Genetic Counselor [email or web address] Phone: [phone]  The patient was seen for a total of 40 minutes in face-to-face genetic counseling.  Dr. Grayland Ormond was available for discussion regarding this case.   _______________________________________________________________________ For Office Staff:  Number of people involved in session: 2 Was an Intern/ student involved with case: yes - [employer] intern Blenda Nicely was present and assisted with this case

## 2021-09-18 ENCOUNTER — Encounter: Payer: Self-pay | Admitting: Sports Medicine

## 2021-09-18 ENCOUNTER — Other Ambulatory Visit: Payer: Self-pay | Admitting: *Deleted

## 2021-09-18 DIAGNOSIS — M25469 Effusion, unspecified knee: Secondary | ICD-10-CM

## 2021-09-25 ENCOUNTER — Ambulatory Visit
Admission: RE | Admit: 2021-09-25 | Discharge: 2021-09-25 | Disposition: A | Discharge status: Home / Self Care | Payer: 59 | Source: Ambulatory Visit | Attending: Sports Medicine | Admitting: Sports Medicine

## 2021-09-25 DIAGNOSIS — M25469 Effusion, unspecified knee: Secondary | ICD-10-CM

## 2021-10-01 ENCOUNTER — Telehealth: Payer: Self-pay | Admitting: Sports Medicine

## 2021-10-01 ENCOUNTER — Encounter: Payer: Self-pay | Admitting: *Deleted

## 2021-10-01 NOTE — Telephone Encounter (Signed)
I spoke with [name] on the phone today after reviewing the MRI of her right knee.  She does have some mild tricompartmental degenerative changes, especially at the patella.  No evidence of meniscal pathology.  She tells me that the swelling in her knee has improved quite a bit since the cortisone injection but she is still experiencing quite a bit of pain especially with running.  I recommended consultation with Dr. Rhona Raider and patient agrees.  I will defer further work-up and treatment to the discretion of Dr. Rhona Raider and the patient will follow-up with me as needed.  This note was dictated using Dragon naturally speaking software and may contain errors in syntax, spelling, or content which have not been identified prior to signing this note.

## 2022-01-24 ENCOUNTER — Ambulatory Visit (INDEPENDENT_AMBULATORY_CARE_PROVIDER_SITE_OTHER): Payer: 59 | Admitting: Sports Medicine

## 2022-01-24 VITALS — BP 110/74 | Ht 60.0 in | Wt 120.0 lb

## 2022-01-24 DIAGNOSIS — M25462 Effusion, left knee: Secondary | ICD-10-CM

## 2022-01-24 DIAGNOSIS — M25461 Effusion, right knee: Secondary | ICD-10-CM | POA: Insufficient documentation

## 2022-01-24 NOTE — Progress Notes (Addendum)
Established Patient Office Visit  Subjective   Patient ID: [name], female    DOB: October 21, 1975  Age: 47 y.o. MRN: [id number]  CC: Bilateral knee pain and swelling   HPI: [name] is a 47 year old female who presents for follow-up of right knee pain and swelling. She first noticed swelling in her right knee in Feb 2023 after standing on the sidelines of a soccer game. Since then, she has had intermittent swelling and pain of her right medial patella preventing her from running. She was last seen on 07/23/21 and got a CSI into her R knee, which helped for about a week. She got an MRI on 9/5 which showed intact ligaments, moderate knee effusion, and mild degenerative chondrosis lateral to patellar apex. She saw Dr. 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 She unfortunately developed similar pain and swelling in her left knee which began around Thanksgiving after doing jump squats. She went back to Dr. Rhona Raider on 12/15 who recommended patella taping and possible arthroscopy with lateral release if the taping results in improvement. [name] is hesitant about surgery at this time.   She has been inconsistently icing and using Naproxen. She has not used compression. She denies a family history of autoimmune disorders. She states that her fingers are often cold and occasionally notices color change in her thumb. She has seen dermatology in the past and was diagnosed with rosacea.     Objective:     BP 110/74   Ht 5' (1.524 m)   Wt 120 lb (54.4 kg)   BMI 23.44 kg/m   Vitals reviewed.   Physical Exam Skin: Reddened skin on cheeks, chin, and above eyebrows.    Left Knee No redness or bruising. Moderate effusion present. Non-tender to palpation. Crepitus present with extension. Full extension, limited flexion. Strength 5/5. Negative anterior drawer.  Positive patellar compression test.  Right Knee No redness or bruising. Mild effusion present. Non-tender to  palpation. Crepitus present with extension. Full extension, limited flexion. Strength 5/5. Minimal laxity with anterior drawer but increased compared to L knee.  Positive patellar compression test.  MRI KNEE RIGHT 09/25/2021 1. Mild tricompartmental degenerative chondrosis with fissuring of the patellar cartilage as described. No subchondral signal abnormality or acute osseous findings. 2. Moderate size joint effusion without evidence of intra-articular loose body. 3. Intact menisci, cruciate and collateral ligaments.    Assessment & Plan:   Problem List Items Addressed This Visit       Musculoskeletal and Integument   Effusion of both knee joints - Primary    Insidious onset, painful, limiting flexion. Given that the initial R knee pain/swelling is now bilateral with no clear injury/trauma, will order  labs to assess for possible autoimmune process, although with crepitus patellar etiology such as PFJOA seems more likely. Recommend consistent daily icing, scheduled Naproxen, and use of compression sleeve. Continue PT with McConnell taping as she only tried once prior. Will follow-up in 2 weeks to assess symptoms and discuss lab results.  - Follow-up CBC, CMP, ESR, CRP, ANA, RF, CCP - Scheduled Naproxen 440 mg BID for 5 days - Compression sleeve  - Daily icing  - McConnel taping with PT  - Open chain exercises  - Follow-up in 2 weeks       Relevant Orders   CBC   Comp Met (CMET)   Antinuclear Antib (ANA)   Sedimentation rate   C-reactive protein   Rheumatoid Factor   CYCLIC  CITRUL PEPTIDE ANTIBODY, IGG/IGA   Follow-up in 2 weeks.    Stefani Dama, Medical Student  Patient seen and evaluated with the medical student.  I agree with the above plan of care.  Although patient's symptoms are likely originating from the chondromalacia seen on her MRI, would like to rule out rheumatological etiologies now that she has bilateral joint effusions.  In the meantime, I do think it is  important for her to trial McConnell taping just to see if that is helpful.  She will also take scheduled naproxen for the next 5 days.  Other treatment as above and follow-up with me again in 2 weeks.

## 2022-01-24 NOTE — Assessment & Plan Note (Addendum)
Insidious onset, painful, limiting flexion. Given that the initial R knee pain/swelling is now bilateral with no clear injury/trauma, will order  labs to assess for possible autoimmune process, although with crepitus patellar etiology such as PFJOA seems more likely. Recommend consistent daily icing, scheduled Naproxen, and use of compression sleeve. Continue PT with McConnell taping as she only tried once prior. Will follow-up in 2 weeks to assess symptoms and discuss lab results.  - Follow-up CBC, CMP, ESR, CRP, ANA, RF, CCP - Scheduled Naproxen 440 mg BID for 5 days - Compression sleeve  - Daily icing  - McConnel taping with PT  - Open chain exercises  - Follow-up in 2 weeks

## 2022-02-07 ENCOUNTER — Ambulatory Visit: Payer: 59 | Admitting: Sports Medicine

## 2022-02-08 LAB — COMPREHENSIVE METABOLIC PANEL
ALT: 15 IU/L (ref 0–32)
AST: 19 IU/L (ref 0–40)
Albumin/Globulin Ratio: 2 (ref 1.2–2.2)
Albumin: 4.9 g/dL (ref 3.9–4.9)
Alkaline Phosphatase: 69 IU/L (ref 44–121)
BUN/Creatinine Ratio: 16 (ref 9–23)
BUN: 12 mg/dL (ref 6–24)
Bilirubin Total: 0.3 mg/dL (ref 0.0–1.2)
CO2: 24 mmol/L (ref 20–29)
Calcium: 9.6 mg/dL (ref 8.7–10.2)
Chloride: 101 mmol/L (ref 96–106)
Creatinine, Ser: 0.73 mg/dL (ref 0.57–1.00)
Globulin, Total: 2.4 g/dL (ref 1.5–4.5)
Glucose: 97 mg/dL (ref 70–99)
Potassium: 4.3 mmol/L (ref 3.5–5.2)
Sodium: 140 mmol/L (ref 134–144)
Total Protein: 7.3 g/dL (ref 6.0–8.5)
eGFR: 102 mL/min/{1.73_m2} (ref >59)

## 2022-02-08 LAB — CBC
Hematocrit: 38.5 % (ref 34.0–46.6)
Hemoglobin: 12.9 g/dL (ref 11.1–15.9)
MCH: 29.9 pg (ref 26.6–33.0)
MCHC: 33.5 g/dL (ref 31.5–35.7)
MCV: 89 fL (ref 79–97)
Platelets: 307 10*3/uL (ref 150–450)
RBC: 4.31 x10E6/uL (ref 3.77–5.28)
RDW: 12.7 % (ref 11.7–15.4)
WBC: 5.4 10*3/uL (ref 3.4–10.8)

## 2022-02-08 LAB — CYCLIC CITRUL PEPTIDE ANTIBODY, IGG/IGA: Cyclic Citrullin Peptide Ab: 8 units (ref 0–19)

## 2022-02-08 LAB — ANA: Anti Nuclear Antibody (ANA): NEGATIVE

## 2022-02-08 LAB — C-REACTIVE PROTEIN: CRP: <1 mg/L (ref 0–10)

## 2022-02-08 LAB — RHEUMATOID FACTOR: Rheumatoid fact SerPl-aCnc: <10 IU/mL (ref <14.0)

## 2022-02-08 LAB — SEDIMENTATION RATE: Sed Rate: 3 mm/hr (ref 0–32)

## 2022-02-20 ENCOUNTER — Encounter: Payer: Self-pay | Admitting: Sports Medicine

## 2022-06-10 ENCOUNTER — Other Ambulatory Visit (HOSPITAL_COMMUNITY): Payer: Self-pay

## 2022-06-10 MED ORDER — HYDROCODONE-ACETAMINOPHEN 5-325 MG PO TABS
1.0000 | ORAL_TABLET | ORAL | 0 refills | Status: DC
  Filled 2022-06-10: qty 8, 2d supply, fill #0

## 2022-06-10 MED ORDER — AMOXICILLIN 500 MG PO CAPS
500.0000 mg | ORAL_CAPSULE | Freq: Three times a day (TID) | ORAL | 0 refills | Status: DC
  Filled 2022-06-10: qty 21, 7d supply, fill #0

## 2022-06-16 IMAGING — DX DG KNEE COMPLETE 4+V*R*
4 series · 4 of 4 positions shown · non-contrast
Comparison: None Available.

CLINICAL DATA: Pain and swelling x2 months

EXAM:
RIGHT KNEE - COMPLETE 4+ VIEW

[knee ap]
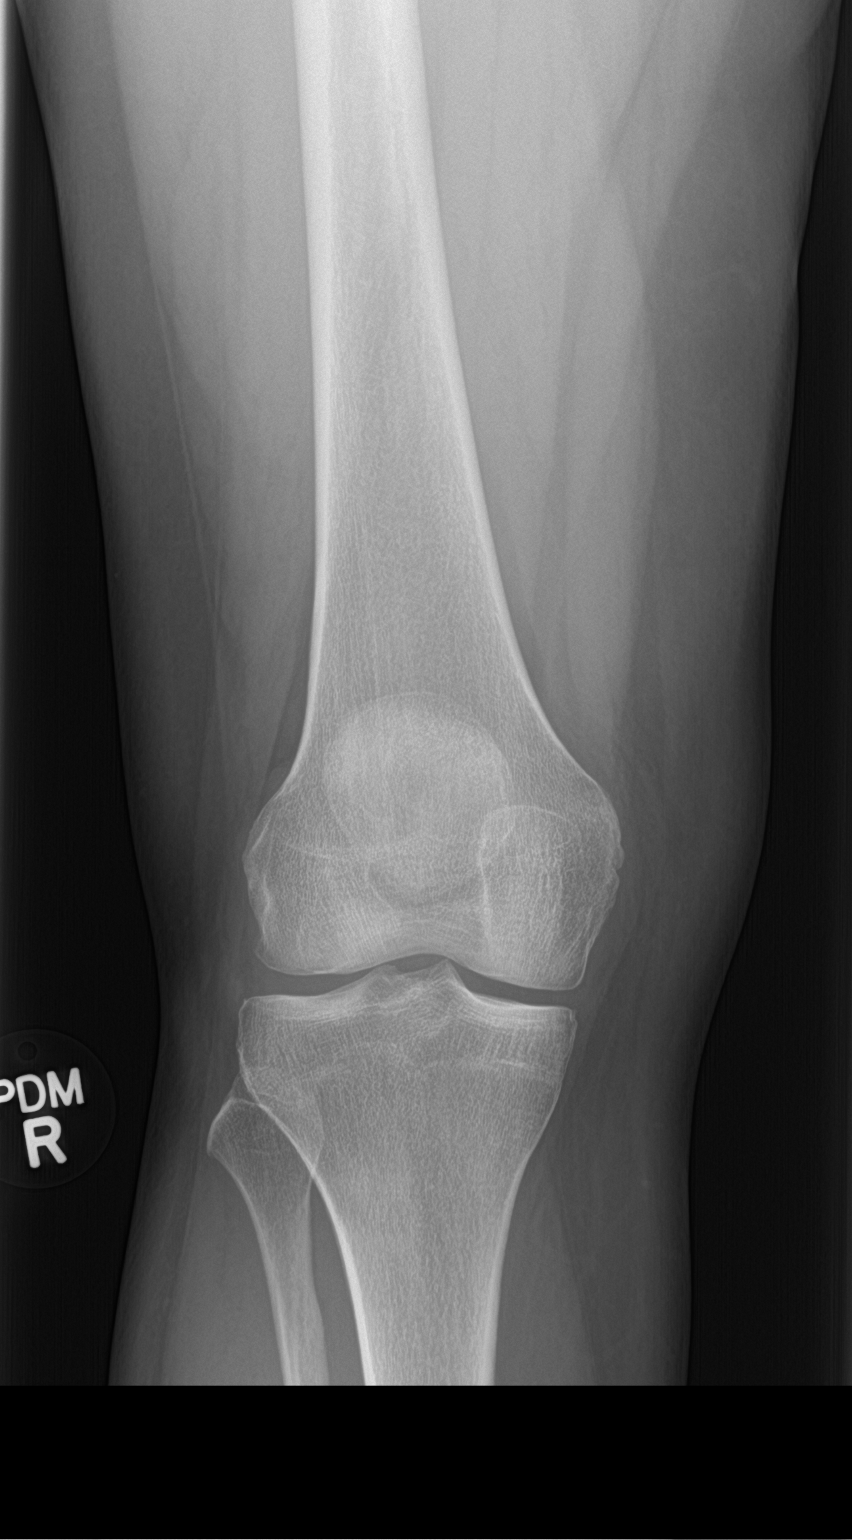

[knee tunnel]
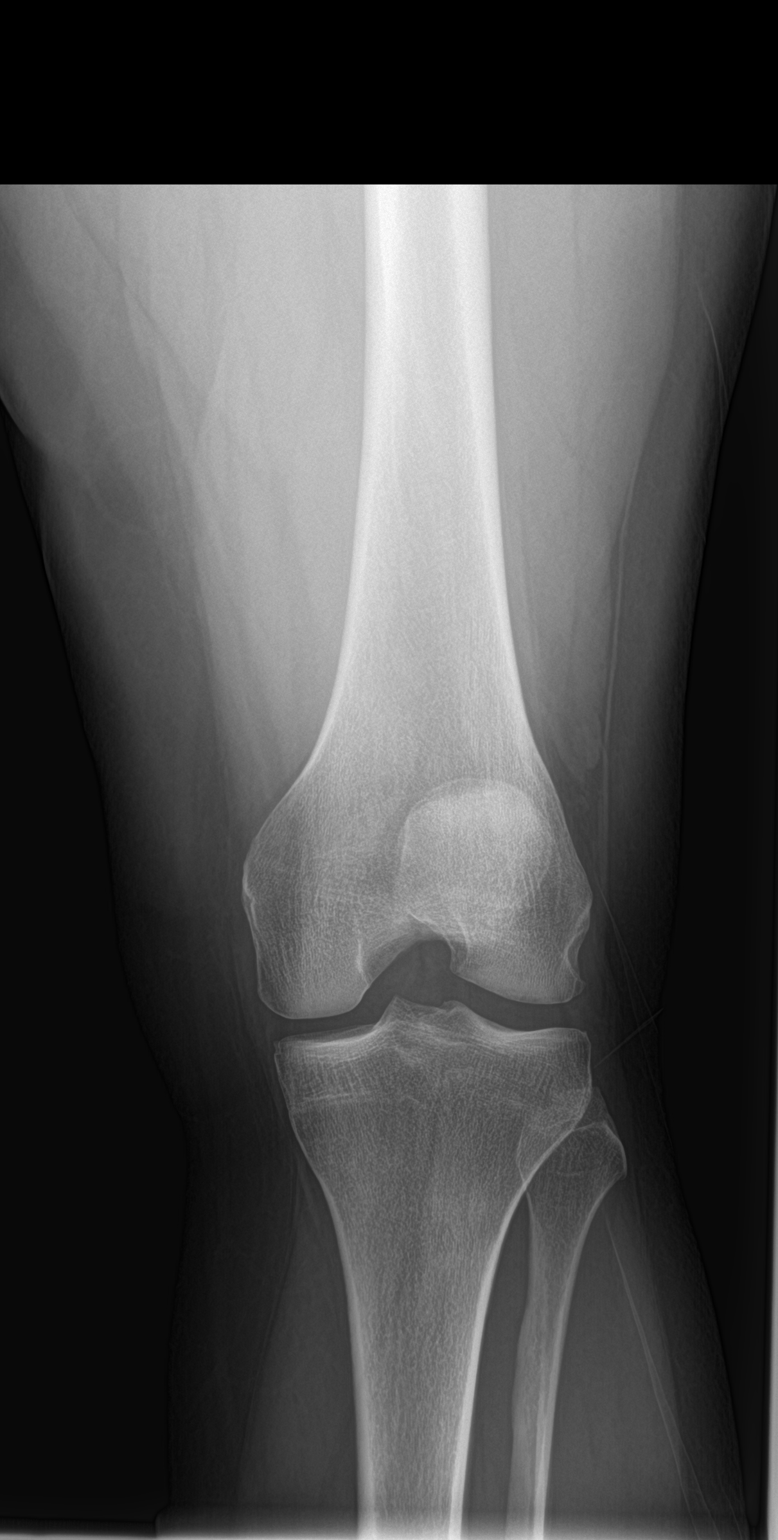

[knee lat]
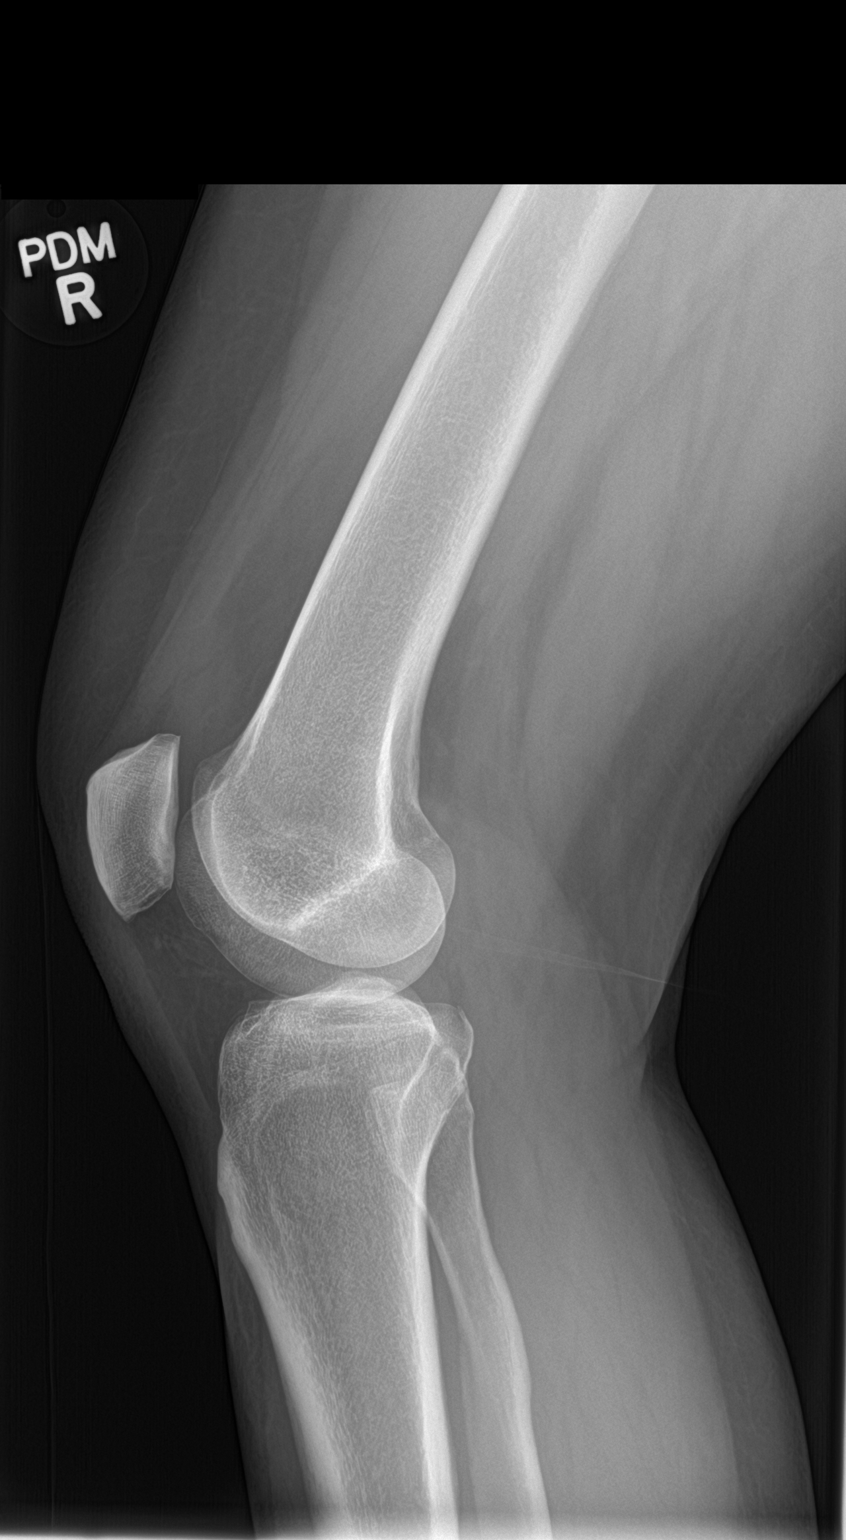

[sunrise]
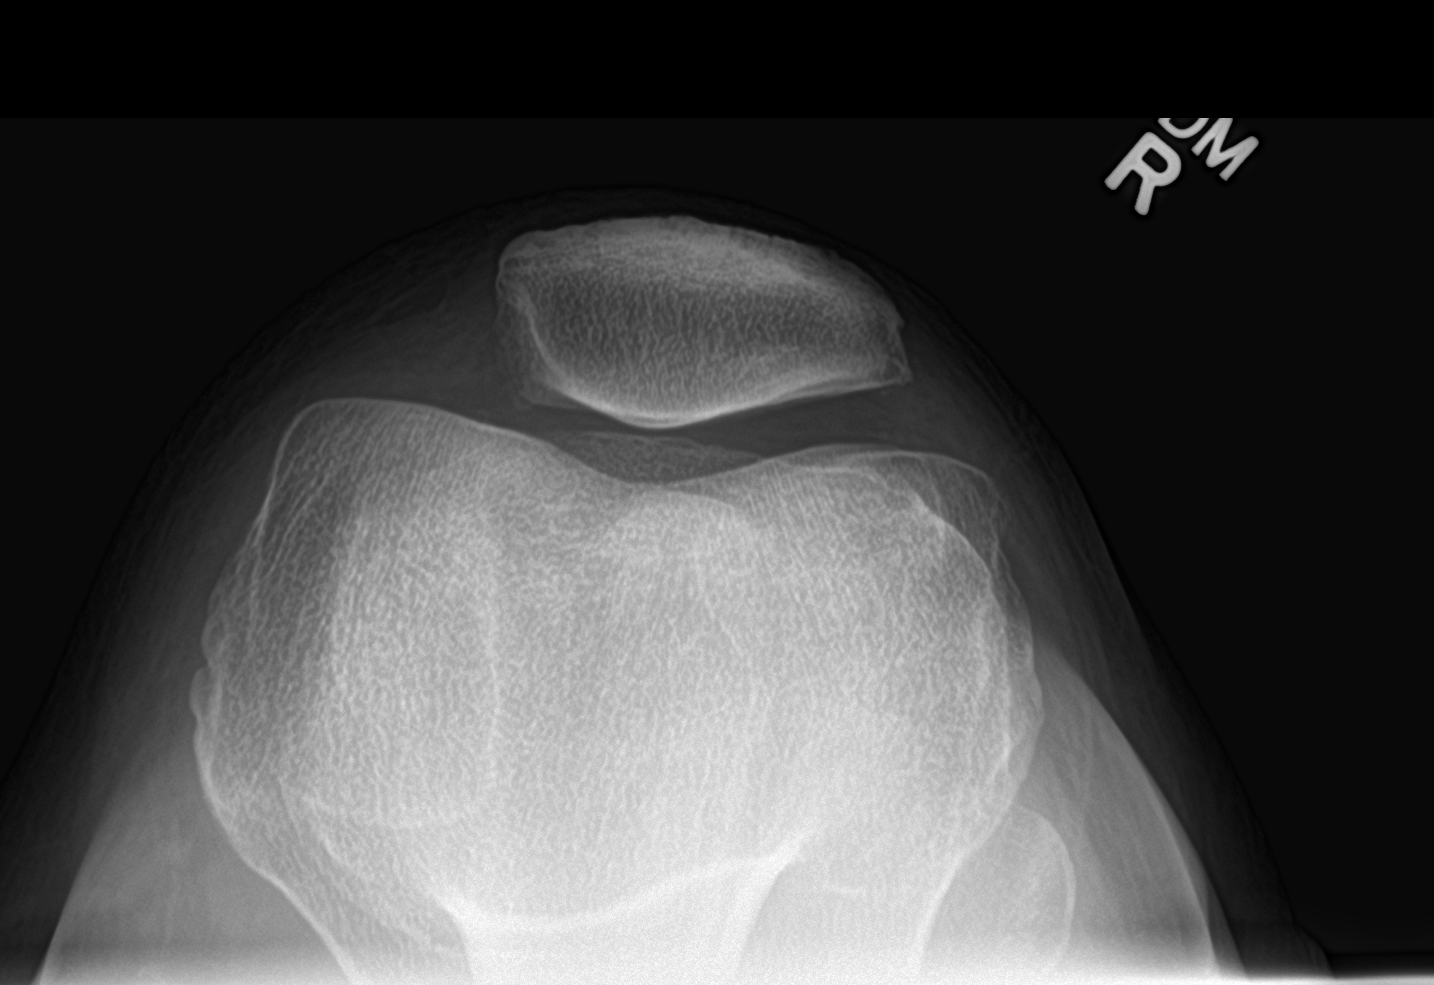

[4 of 4 positions shown; findings below may reference images not displayed]

FINDINGS: No fracture or dislocation is seen. There are tiny bony spurs in the
medial, lateral and patellofemoral compartments. There is soft
tissue fullness in the suprapatellar bursa.
IMPRESSION: No fracture or dislocation is seen. Small effusion is present in the
suprapatellar bursa. Mild degenerative changes are noted with tiny
bony spurs.

## 2022-06-27 ENCOUNTER — Other Ambulatory Visit (HOSPITAL_COMMUNITY): Payer: Self-pay

## 2022-06-27 MED ORDER — AZITHROMYCIN 250 MG PO TABS
250.0000 mg | ORAL_TABLET | ORAL | 0 refills | Status: DC
  Filled 2022-06-27: qty 6, 5d supply, fill #0

## 2022-06-28 ENCOUNTER — Other Ambulatory Visit (HOSPITAL_COMMUNITY): Payer: Self-pay

## 2022-07-03 ENCOUNTER — Other Ambulatory Visit (HOSPITAL_COMMUNITY): Payer: Self-pay

## 2022-07-03 MED ORDER — AMOXICILLIN-POT CLAVULANATE 875-125 MG PO TABS
1.0000 | ORAL_TABLET | Freq: Two times a day (BID) | ORAL | 0 refills | Status: DC
  Filled 2022-07-03: qty 20, 10d supply, fill #0

## 2023-06-26 ENCOUNTER — Other Ambulatory Visit: Payer: Self-pay | Admitting: Obstetrics and Gynecology

## 2023-06-26 DIAGNOSIS — Z1231 Encounter for screening mammogram for malignant neoplasm of breast: Secondary | ICD-10-CM

## 2023-07-01 ENCOUNTER — Other Ambulatory Visit: Payer: Self-pay

## 2023-07-01 DIAGNOSIS — L918 Other hypertrophic disorders of the skin: Secondary | ICD-10-CM | POA: Insufficient documentation

## 2023-07-01 DIAGNOSIS — L719 Rosacea, unspecified: Secondary | ICD-10-CM | POA: Insufficient documentation

## 2023-07-22 ENCOUNTER — Ambulatory Visit (AMBULATORY_SURGERY_CENTER)

## 2023-07-22 ENCOUNTER — Other Ambulatory Visit: Payer: Self-pay

## 2023-07-22 ENCOUNTER — Other Ambulatory Visit (HOSPITAL_COMMUNITY): Payer: Self-pay

## 2023-07-22 VITALS — Ht 60.0 in | Wt 120.0 lb

## 2023-07-22 DIAGNOSIS — Z860100 Personal history of colon polyps, unspecified: Secondary | ICD-10-CM

## 2023-07-22 MED ORDER — NA SULFATE-K SULFATE-MG SULF 17.5-3.13-1.6 GM/177ML PO SOLN
1.0000 | Freq: Once | ORAL | 0 refills | Status: AC
  Filled 2023-07-22: qty 354, 2d supply, fill #0

## 2023-07-22 NOTE — Progress Notes (Signed)
 Denies allergies to eggs or soy products. Denies complication of anesthesia or sedation. Denies use of weight loss medication. Denies use of O2.   Emmi instructions given for colonoscopy.

## 2023-08-12 ENCOUNTER — Encounter: Payer: Self-pay | Admitting: Gastroenterology

## 2023-08-15 ENCOUNTER — Encounter: Admitting: Gastroenterology

## 2023-09-01 ENCOUNTER — Ambulatory Visit
Admission: RE | Admit: 2023-09-01 | Discharge: 2023-09-01 | Disposition: A | Discharge status: Home / Self Care | Source: Ambulatory Visit | Attending: Obstetrics and Gynecology | Admitting: Obstetrics and Gynecology

## 2023-09-01 DIAGNOSIS — Z1231 Encounter for screening mammogram for malignant neoplasm of breast: Secondary | ICD-10-CM

## 2023-09-03 ENCOUNTER — Other Ambulatory Visit: Payer: Self-pay | Admitting: Obstetrics and Gynecology

## 2023-09-03 DIAGNOSIS — N6320 Unspecified lump in the left breast, unspecified quadrant: Secondary | ICD-10-CM

## 2023-09-03 DIAGNOSIS — R928 Other abnormal and inconclusive findings on diagnostic imaging of breast: Secondary | ICD-10-CM

## 2023-09-11 ENCOUNTER — Inpatient Hospital Stay
Admission: RE | Admit: 2023-09-11 | Discharge: 2023-09-11 | Discharge status: Home / Self Care | Source: Ambulatory Visit | Attending: Obstetrics and Gynecology | Admitting: Obstetrics and Gynecology

## 2023-09-11 DIAGNOSIS — R928 Other abnormal and inconclusive findings on diagnostic imaging of breast: Secondary | ICD-10-CM

## 2023-09-11 DIAGNOSIS — N6320 Unspecified lump in the left breast, unspecified quadrant: Secondary | ICD-10-CM

## 2023-10-03 ENCOUNTER — Encounter: Admitting: Gastroenterology

## 2023-11-03 ENCOUNTER — Ambulatory Visit (AMBULATORY_SURGERY_CENTER): Admitting: Gastroenterology

## 2023-11-03 ENCOUNTER — Encounter: Payer: Self-pay | Admitting: Gastroenterology

## 2023-11-03 VITALS — BP 97/55 | HR 64 | Temp 97.5°F | Resp 21 | Ht 60.0 in | Wt 120.0 lb

## 2023-11-03 DIAGNOSIS — D123 Benign neoplasm of transverse colon: Secondary | ICD-10-CM | POA: Diagnosis not present

## 2023-11-03 DIAGNOSIS — D122 Benign neoplasm of ascending colon: Secondary | ICD-10-CM | POA: Diagnosis not present

## 2023-11-03 DIAGNOSIS — K635 Polyp of colon: Secondary | ICD-10-CM | POA: Diagnosis not present

## 2023-11-03 DIAGNOSIS — Z1211 Encounter for screening for malignant neoplasm of colon: Secondary | ICD-10-CM

## 2023-11-03 DIAGNOSIS — Z800 Family history of malignant neoplasm of digestive organs: Secondary | ICD-10-CM

## 2023-11-03 MED ORDER — SODIUM CHLORIDE 0.9 % IV SOLN: 500.0000 mL | Freq: Once | INTRAVENOUS | Status: DC

## 2023-11-03 NOTE — Progress Notes (Signed)
 Called to room to assist during endoscopic procedure.  Patient ID and intended procedure confirmed with present staff. Received instructions for my participation in the procedure from the performing physician.

## 2023-11-03 NOTE — Progress Notes (Signed)
 [facility] History and Physical   Primary Care Physician:  Kimberly Reyes SAUNDERS, MD   Reason for Procedure:   Family history of colon cancer  Plan:    colonoscopy     HPI: [name] is a 48 y.o. female  here for colonoscopy surveillance - family history of colon cancer, mother had CRC dx age 3s . Last exam 2020.   Patient denies any bowel symptoms at this time. Otherwise feels well without any cardiopulmonary symptoms.   I have discussed risks / benefits of anesthesia and endoscopic procedure with [name] and they wish to proceed with the exams as outlined today.    History reviewed. No pertinent past medical history.  Past Surgical History:  Procedure Laterality Date   COLONOSCOPY  2010, 09/22/2012    Prior to Admission medications   Medication Sig Start Date End Date Taking? Authorizing Provider  Ca Phosphate-Cholecalciferol  (CALCIUM WITH D3 PO) Take by mouth.   Yes [provider]  IUD'S IU IUD's   Yes [provider]  VITAMIN D  PO Take by mouth.   Yes [provider]  Ascorbic Acid (VITAMIN C PO) Take by mouth.    [provider]  ergocalciferol  (VITAMIN D2) 1.25 MG (50000 UT) capsule Take 1 capsule (50,000 Units total) by mouth once a week. Patient not taking: Reported on 05/30/2021 08/08/20     ibuprofen (ADVIL) 600 MG tablet Take 1 tablet(s) by oral route every 6 hours as needed for pain 10/26/14   [provider]  Multiple Vitamins-Minerals (MULTIVITAMIN WITH MINERALS) tablet Take 1 tablet by mouth daily.    [provider]    Current Outpatient Medications  Medication Sig Dispense Refill   Ca Phosphate-Cholecalciferol  (CALCIUM WITH D3 PO) Take by mouth.     IUD'S IU IUD's     VITAMIN D  PO Take by mouth.     Ascorbic Acid (VITAMIN C PO) Take by mouth.     ergocalciferol  (VITAMIN D2) 1.25 MG (50000 UT) capsule Take 1 capsule (50,000 Units total) by mouth once a week. (Patient not taking:  Reported on 05/30/2021) 8 capsule 0   ibuprofen (ADVIL) 600 MG tablet Take 1 tablet(s) by oral route every 6 hours as needed for pain     Multiple Vitamins-Minerals (MULTIVITAMIN WITH MINERALS) tablet Take 1 tablet by mouth daily.     Current Facility-Administered Medications  Medication Dose Route Frequency Provider Last Rate Last Admin   0.9 %  sodium chloride  infusion  500 mL Intravenous Once Gabriellia Rempel, Elspeth SQUIBB, MD        Allergies as of 11/03/2023   (No Known Allergies)    Family History  Problem Relation Age of Onset   Colon cancer Mother 34   Thyroid  cancer Mother 54   Colon polyps Sister        pre-cancerous   Thyroid  cancer Sister        26; hx genetic testing inconclusive   Colon polyps Sister    Colon cancer Maternal Grandmother 72   Hypertension Maternal Grandfather    Diabetes Maternal Grandfather    Coronary artery disease Paternal Grandmother    Stroke Paternal Grandmother    Esophageal cancer Neg Hx    Rectal cancer Neg Hx    Stomach cancer Neg Hx     Social History   Socioeconomic History   Marital status: Married    Spouse name: Not on file   Number of children: 3   Years of education: Not on  file   Highest education level: Not on file  Occupational History   Occupation: Pharmacist    Employer: [employer]  Tobacco Use   Smoking status: Never   Smokeless tobacco: Never  Vaping Use   Vaping status: Never Used  Substance and Sexual Activity   Alcohol use: Yes    Alcohol/week: 7.0 standard drinks of alcohol    Types: 7 Glasses of wine per week    Comment: 7 glasses of wine per week, social   Drug use: No   Sexual activity: Yes    Birth control/protection: Condom  Other Topics Concern   Not on file  Social History Narrative   Not on file   Social Drivers of Health   Financial Resource Strain: Not on file  Food Insecurity: Not on file  Transportation Needs: Not on file  Physical Activity: Not on file  Stress: Not on file  Social  Connections: Not on file  Intimate Partner Violence: Not on file    Review of Systems: All other review of systems negative except as mentioned in the HPI.  Physical Exam: Vital signs BP (!) 98/57   Pulse 74   Temp (!) 97.5 F (36.4 C) (Skin)   Ht 5' (1.524 m)   Wt 120 lb (54.4 kg)   SpO2 100%   BMI 23.44 kg/m   General:   Alert,  Well-developed, pleasant and cooperative in NAD Lungs:  Clear throughout to auscultation.   Heart:  Regular rate and rhythm Abdomen:  Soft, nontender and nondistended.   Neuro/Psych:  Alert and cooperative. Normal mood and affect. A and O x 3  Kimberly Naval, MD [facility]

## 2023-11-03 NOTE — Patient Instructions (Signed)
 YOU HAD AN ENDOSCOPIC PROCEDURE TODAY AT THE [facility]:   Refer to the procedure report that was given to you for any specific questions about what was found during the examination.  If the procedure report does not answer your questions, please call your gastroenterologist to clarify.  If you requested that your care partner not be given the details of your procedure findings, then the procedure report has been included in a sealed envelope for you to review at your convenience later.  YOU SHOULD EXPECT: Some feelings of bloating in the abdomen. Passage of more gas than usual.  Walking can help get rid of the air that was put into your GI tract during the procedure and reduce the bloating. If you had a lower endoscopy (such as a colonoscopy or flexible sigmoidoscopy) you may notice spotting of blood in your stool or on the toilet paper. If you underwent a bowel prep for your procedure, you may not have a normal bowel movement for a few days.  Please Note:  You might notice some irritation and congestion in your nose or some drainage.  This is from the oxygen used during your procedure.  There is no need for concern and it should clear up in a day or so.  SYMPTOMS TO REPORT IMMEDIATELY:  Following lower endoscopy (colonoscopy or flexible sigmoidoscopy):  Excessive amounts of blood in the stool  Significant tenderness or worsening of abdominal pains  Swelling of the abdomen that is new, acute  Fever of 100F or higher  Resume previous diet Continue present medications Await pathology results  For urgent or emergent issues, a gastroenterologist can be reached at any hour by calling [phone]. Do not use MyChart messaging for urgent concerns.    DIET:  We do recommend a small meal at first, but then you may proceed to your regular diet.  Drink plenty of fluids but you should avoid alcoholic beverages for 24 hours.  ACTIVITY:  You should plan to take it easy for the rest of  today and you should NOT DRIVE or use heavy machinery until tomorrow (because of the sedation medicines used during the test).    FOLLOW UP: Our staff will call the number listed on your records the next business day following your procedure.  We will call around 7:15- 8:00 am to check on you and address any questions or concerns that you may have regarding the information given to you following your procedure. If we do not reach you, we will leave a message.     If any biopsies were taken you will be contacted by phone or by letter within the next 1-3 weeks.  Please call us  at [phone] if you have not heard about the biopsies in 3 weeks.    SIGNATURES/CONFIDENTIALITY: You and/or your care partner have signed paperwork which will be entered into your electronic medical record.  These signatures attest to the fact that that the information above on your After Visit Summary has been reviewed and is understood.  Full responsibility of the confidentiality of this discharge information lies with you and/or your care-partner.

## 2023-11-03 NOTE — Op Note (Signed)
 [facility] Patient Name: [name] Procedure Date: 11/03/2023 7:33 AM MRN: [id number] Endoscopist: Kimberly P. Leigh , MD, [id number] Age: 48 Referring MD:  Date of Birth: 1975/02/11 Gender: Female Account #: [id number] Procedure:                Colonoscopy Indications:              Screening in patient at increased risk: Family                            history of 1st-degree relative with colorectal                            cancer (mother and grandmother dx age 37s -                            negative genetic testing in mother) Medicines:                Monitored Anesthesia Care Procedure:                Pre-Anesthesia Assessment:                           - Prior to the procedure, a History and Physical                            was performed, and patient medications and                            allergies were reviewed. The patient's tolerance of                            previous anesthesia was also reviewed. The risks                            and benefits of the procedure and the sedation                            options and risks were discussed with the patient.                            All questions were answered, and informed consent                            was obtained. Prior Anticoagulants: The patient has                            taken no anticoagulant or antiplatelet agents. ASA                            Grade Assessment: I - A normal, healthy patient.                            After reviewing the risks and benefits, the patient  was deemed in satisfactory condition to undergo the                            procedure.                           After obtaining informed consent, the colonoscope                            was passed under direct vision. Throughout the                            procedure, the patient's blood pressure, pulse, and                            oxygen saturations were monitored  continuously. The                            Olympus Scope [id number] was introduced through the                            anus and advanced to the the cecum, identified by                            appendiceal orifice and ileocecal valve. The                            colonoscopy was performed without difficulty. The                            patient tolerated the procedure well. The quality                            of the bowel preparation was good. The ileocecal                            valve, appendiceal orifice, and rectum were                            photographed. Scope In: 8:03:21 AM Scope Out: 8:21:49 AM Scope Withdrawal Time: 0 hours 15 minutes 42 seconds  Total Procedure Duration: 0 hours 18 minutes 28 seconds  Findings:                 The perianal and digital rectal examinations were                            normal.                           A 4 to 5 mm polyp was found in the ascending colon.                            The polyp was flat. The polyp was removed with a  cold snare. Resection and retrieval were complete.                           A 4 mm polyp was found in the hepatic flexure. The                            polyp was sessile. The polyp was removed with a                            cold snare. Resection and retrieval were complete.                           A 5 to 6 mm polyp was found in the transverse                            colon. The polyp was flat. The polyp was removed                            with a cold snare. Resection and retrieval were                            complete.                           The exam was otherwise without abnormality. Complications:            No immediate complications. Estimated blood loss:                            Minimal. Estimated Blood Loss:     Estimated blood loss was minimal. Impression:               - One 4 to 5 mm polyp in the ascending colon,                             removed with a cold snare. Resected and retrieved.                           - One 4 mm polyp at the hepatic flexure, removed                            with a cold snare. Resected and retrieved.                           - One 5 to 6 mm polyp in the transverse colon,                            removed with a cold snare. Resected and retrieved.                           - The examination was otherwise normal. Recommendation:           - Patient has a contact number available for  emergencies. The signs and symptoms of potential                            delayed complications were discussed with the                            patient. Return to normal activities tomorrow.                            Written discharge instructions were provided to the                            patient.                           - Resume previous diet.                           - Continue present medications.                           - Await pathology results. Kimberly P. Dalina Samara, MD 11/03/2023 8:37:27 AM This report has been signed electronically.

## 2023-11-03 NOTE — Progress Notes (Signed)
 VS by CL  Pt's states no medical or surgical changes since previsit or office visit.

## 2023-11-03 NOTE — Progress Notes (Signed)
 Transferred to PACU via stretcher, arousing, VSS.

## 2023-11-04 ENCOUNTER — Telehealth: Payer: Self-pay | Admitting: *Deleted

## 2023-11-04 NOTE — Telephone Encounter (Signed)
 Attempted f/u phone call. No answer. Left message.

## 2023-11-06 ENCOUNTER — Ambulatory Visit: Payer: Self-pay | Admitting: Gastroenterology

## 2023-11-06 LAB — SURGICAL PATHOLOGY

## 2023-11-17 ENCOUNTER — Encounter: Admitting: Gastroenterology
